# Patient Record
Sex: Female | Born: 1970 | Race: White | Hispanic: No | Marital: Married | Smoking: Former smoker
Health system: Southern US, Community
[De-identification: ages and names within clinical notes are randomized; demographics above are authoritative.]

## PROBLEM LIST (undated history)

## (undated) DIAGNOSIS — N879 Dysplasia of cervix uteri, unspecified: Secondary | ICD-10-CM

## (undated) HISTORY — PX: COLPOSCOPY: SHX161

## (undated) HISTORY — DX: Dysplasia of cervix uteri, unspecified: N87.9

## (undated) HISTORY — PX: OTHER SURGICAL HISTORY: SHX169

## (undated) HISTORY — PX: LAPAROSCOPIC GASTRIC BANDING: SHX1100

## (undated) HISTORY — PX: GYNECOLOGIC CRYOSURGERY: SHX857

## (undated) HISTORY — PX: BACK SURGERY: SHX140

---

## 1998-03-18 ENCOUNTER — Inpatient Hospital Stay (HOSPITAL_COMMUNITY): Admission: RE | Admit: 1998-03-18 | Discharge: 1998-03-19 | Payer: Self-pay | Admitting: Neurosurgery

## 2000-06-23 ENCOUNTER — Encounter: Payer: Self-pay | Admitting: Neurosurgery

## 2000-06-23 ENCOUNTER — Ambulatory Visit (HOSPITAL_COMMUNITY): Admission: RE | Admit: 2000-06-23 | Discharge: 2000-06-23 | Payer: Self-pay | Admitting: Neurosurgery

## 2003-08-24 ENCOUNTER — Other Ambulatory Visit: Admission: RE | Admit: 2003-08-24 | Discharge: 2003-08-24 | Payer: Self-pay | Admitting: Obstetrics and Gynecology

## 2003-12-31 ENCOUNTER — Ambulatory Visit (HOSPITAL_COMMUNITY): Admission: RE | Admit: 2003-12-31 | Discharge: 2004-01-01 | Payer: Self-pay | Admitting: Neurosurgery

## 2006-06-21 ENCOUNTER — Other Ambulatory Visit: Admission: RE | Admit: 2006-06-21 | Discharge: 2006-06-21 | Payer: Self-pay | Admitting: Obstetrics and Gynecology

## 2006-10-30 ENCOUNTER — Emergency Department (HOSPITAL_COMMUNITY): Admission: EM | Admit: 2006-10-30 | Discharge: 2006-10-30 | Payer: Self-pay | Admitting: Emergency Medicine

## 2007-07-22 ENCOUNTER — Other Ambulatory Visit: Admission: RE | Admit: 2007-07-22 | Discharge: 2007-07-22 | Payer: Self-pay | Admitting: Obstetrics and Gynecology

## 2009-03-15 ENCOUNTER — Ambulatory Visit: Payer: Self-pay | Admitting: Obstetrics and Gynecology

## 2009-03-15 ENCOUNTER — Other Ambulatory Visit: Admission: RE | Admit: 2009-03-15 | Discharge: 2009-03-15 | Payer: Self-pay | Admitting: Obstetrics and Gynecology

## 2009-03-15 ENCOUNTER — Encounter: Payer: Self-pay | Admitting: Obstetrics and Gynecology

## 2009-03-16 ENCOUNTER — Ambulatory Visit: Payer: Self-pay | Admitting: Obstetrics and Gynecology

## 2009-04-08 ENCOUNTER — Encounter: Admission: RE | Admit: 2009-04-08 | Discharge: 2009-04-08 | Payer: Self-pay | Admitting: Obstetrics and Gynecology

## 2009-07-13 ENCOUNTER — Ambulatory Visit (HOSPITAL_COMMUNITY): Admission: RE | Admit: 2009-07-13 | Discharge: 2009-07-13 | Payer: Self-pay | Admitting: Surgery

## 2009-07-21 ENCOUNTER — Ambulatory Visit (HOSPITAL_COMMUNITY): Admission: RE | Admit: 2009-07-21 | Discharge: 2009-07-21 | Payer: Self-pay | Admitting: Surgery

## 2009-07-27 ENCOUNTER — Ambulatory Visit (HOSPITAL_BASED_OUTPATIENT_CLINIC_OR_DEPARTMENT_OTHER): Admission: RE | Admit: 2009-07-27 | Discharge: 2009-07-27 | Payer: Self-pay | Admitting: Surgery

## 2009-07-27 ENCOUNTER — Encounter: Admission: RE | Admit: 2009-07-27 | Discharge: 2009-07-27 | Payer: Self-pay | Admitting: Surgery

## 2009-07-30 ENCOUNTER — Ambulatory Visit: Payer: Self-pay | Admitting: Internal Medicine

## 2009-10-28 ENCOUNTER — Encounter: Admission: RE | Admit: 2009-10-28 | Discharge: 2009-11-09 | Payer: Self-pay | Admitting: Surgery

## 2009-11-21 ENCOUNTER — Ambulatory Visit (HOSPITAL_COMMUNITY): Admission: RE | Admit: 2009-11-21 | Discharge: 2009-11-22 | Payer: Self-pay | Admitting: Surgery

## 2009-12-07 ENCOUNTER — Encounter: Admission: RE | Admit: 2009-12-07 | Discharge: 2010-03-07 | Payer: Self-pay | Admitting: Surgery

## 2010-01-19 ENCOUNTER — Encounter: Admission: RE | Admit: 2010-01-19 | Discharge: 2010-03-13 | Payer: Self-pay | Admitting: Surgery

## 2010-03-17 ENCOUNTER — Other Ambulatory Visit: Admission: RE | Admit: 2010-03-17 | Discharge: 2010-03-17 | Payer: Self-pay | Admitting: Obstetrics and Gynecology

## 2010-03-17 ENCOUNTER — Ambulatory Visit: Payer: Self-pay | Admitting: Obstetrics and Gynecology

## 2010-05-01 ENCOUNTER — Encounter: Admission: RE | Admit: 2010-05-01 | Discharge: 2010-05-16 | Payer: Self-pay | Admitting: Surgery

## 2010-08-15 ENCOUNTER — Encounter
Admission: RE | Admit: 2010-08-15 | Discharge: 2010-08-15 | Payer: Self-pay | Source: Home / Self Care | Attending: Surgery | Admitting: Surgery

## 2011-01-28 LAB — COMPREHENSIVE METABOLIC PANEL
CO2: 26 mEq/L (ref 19–32)
Calcium: 8.8 mg/dL (ref 8.4–10.5)
Creatinine, Ser: 0.67 mg/dL (ref 0.4–1.2)
GFR calc non Af Amer: >60 mL/min (ref >60)
Glucose, Bld: 91 mg/dL (ref 70–99)

## 2011-01-28 LAB — DIFFERENTIAL
Eosinophils Absolute: 0.1 10*3/uL (ref 0.0–0.7)
Eosinophils Relative: 0 % (ref 0–5)
Eosinophils Relative: 1 % (ref 0–5)
Lymphocytes Relative: 18 % (ref 12–46)
Lymphs Abs: 1.8 10*3/uL (ref 0.7–4.0)
Lymphs Abs: 2.2 10*3/uL (ref 0.7–4.0)
Monocytes Absolute: 0.8 10*3/uL (ref 0.1–1.0)
Monocytes Relative: 5 % (ref 3–12)

## 2011-01-28 LAB — CBC
HCT: 37.9 % (ref 36.0–46.0)
HCT: 39.6 % (ref 36.0–46.0)
Hemoglobin: 12.9 g/dL (ref 12.0–15.0)
MCHC: 34.8 g/dL (ref 30.0–36.0)
MCV: 98.9 fL (ref 78.0–100.0)
Platelets: 233 10*3/uL (ref 150–400)
Platelets: 236 10*3/uL (ref 150–400)
RBC: 3.79 MIL/uL — ABNORMAL LOW (ref 3.87–5.11)
RBC: 4 MIL/uL (ref 3.87–5.11)
WBC: 10 10*3/uL (ref 4.0–10.5)
WBC: 7.4 10*3/uL (ref 4.0–10.5)

## 2011-01-29 ENCOUNTER — Other Ambulatory Visit: Payer: Self-pay | Admitting: Obstetrics and Gynecology

## 2011-01-29 DIAGNOSIS — Z1231 Encounter for screening mammogram for malignant neoplasm of breast: Secondary | ICD-10-CM

## 2011-02-06 ENCOUNTER — Ambulatory Visit
Admission: RE | Admit: 2011-02-06 | Discharge: 2011-02-06 | Disposition: A | Discharge status: Home / Self Care | Payer: BC Managed Care – PPO | Source: Ambulatory Visit | Attending: Obstetrics and Gynecology | Admitting: Obstetrics and Gynecology

## 2011-02-06 DIAGNOSIS — Z1231 Encounter for screening mammogram for malignant neoplasm of breast: Secondary | ICD-10-CM

## 2011-03-19 ENCOUNTER — Encounter (INDEPENDENT_AMBULATORY_CARE_PROVIDER_SITE_OTHER): Payer: BC Managed Care – PPO | Admitting: Obstetrics and Gynecology

## 2011-03-19 ENCOUNTER — Other Ambulatory Visit: Payer: Self-pay | Admitting: Obstetrics and Gynecology

## 2011-03-19 ENCOUNTER — Other Ambulatory Visit (HOSPITAL_COMMUNITY)
Admission: RE | Admit: 2011-03-19 | Discharge: 2011-03-19 | Disposition: A | Discharge status: Home / Self Care | Payer: BC Managed Care – PPO | Source: Ambulatory Visit | Attending: Obstetrics and Gynecology | Admitting: Obstetrics and Gynecology

## 2011-03-19 DIAGNOSIS — Z01419 Encounter for gynecological examination (general) (routine) without abnormal findings: Secondary | ICD-10-CM

## 2011-03-19 DIAGNOSIS — Z833 Family history of diabetes mellitus: Secondary | ICD-10-CM

## 2011-03-19 DIAGNOSIS — R823 Hemoglobinuria: Secondary | ICD-10-CM

## 2011-03-19 DIAGNOSIS — Z124 Encounter for screening for malignant neoplasm of cervix: Secondary | ICD-10-CM | POA: Insufficient documentation

## 2011-03-19 DIAGNOSIS — Z13220 Encounter for screening for lipoid disorders: Secondary | ICD-10-CM

## 2011-03-30 NOTE — Op Note (Signed)
NAMECLAUDIE, [name] NO.:  [id number]   MEDICAL RECORD NO.:  [id number]                   PATIENT TYPE:  OIB   LOCATION:  3172                                 FACILITY:  [facility]   PHYSICIAN:  Danae Orleans. Venetia Maxon, M.D.               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   PROCEDURE:  Right L5-S1 microdiskectomy with microdissection.   SURGEON:  Danae Orleans. Venetia Maxon, M.D.   ASSISTANT:  Clydene Fake, M.D.   ANESTHESIA:  General endotracheal anesthesia.   ESTIMATED BLOOD LOSS:  Minimal.   COMPLICATIONS:  None.   DISPOSITION:  To recovery.   INDICATIONS FOR PROCEDURE:  Shelby Martin is a 40 year old woman whose had  prior left L5-S1 disk herniation who now presents with a right L5-S1 disk  herniation and right lower extremity pain with an S1 radiculopathy.  It was  elected to take her surgery for microdiskectomy.   Shelby Martin was brought to the operating room. Following a satisfactory and  uncomplicated induction of general endotracheal anesthesia and placement of  intravenous lines, the patient was placed in the prone position on the  Wilson frame, her low back was then prepped and draped in the usual sterile  fashion.  The area of planned incision was infiltrated with 0.25% Marcaine  and 0.5% lidocaine 1:200,000 epinephrine. An incision was made in the  midline and carried through approximately 6 inches of adipose tissue to  along the dorsal fascia which was incised in the right side of the midline.  Subperiosteal dissection was performed exposing the L5-S1 interspace.  Intraoperative x-ray confirmed correct  orientation. A Versatrac  retractor  was used to facilitate exposure using extra long instrument. A  hemisemilaminectomy of L5 was performed with the high speed drill and then  completed with Kerrison rongeurs. Ligamentum flavum was detached and removed  in a piecemeal fashion. A foraminotomy was performed overlying the right S1  nerve root. The lateral aspect of the canal was identified and S1 nerve root  and thecal sac were retracted medially after mobilizing them with  microdissection technique under the operating microscope.  Multiple  fragments of disk material were identified and removed which were just  beneath the take off of the S1 nerve root. The interspace was then opened as  the pieces of disk extended into the interspace. The disk space was then  cleared out of residual disk material using a variety of pituitary rongeurs,  Epstein curettes and surgical __________ downgoing curettes. The  hypertrophied osteophytic ledge was removed on the S1 level and both the S1  nerve root and the common  dural tube were well decompressed.  Hemostasis was  assured, the wound was copiously irrigated with bacitracin and saline. The  self retaining retractor was removed, microscope was taken out of the field,  lumbodorsal fascia was closed with #0 Vicryl suture. The subcutaneous tissue  was reapproximated with 2-0 Vicryl interrupted inverted sutures and the skin  edges reapproximated with interrupted 3-0 Vicryl subcuticular stitch and the  wound dressed with Dermabond.  The patient was extubated in the operating  room and taken to the recovery room in stable satisfactory condition having  tolerated the operation well. Counts were correct at the end of the case.                                               Danae Orleans. Venetia Maxon, M.D.    JDS/MEDQ  D:  12/31/2003  T:  01/01/2004  Job:  [id number]

## 2011-10-19 ENCOUNTER — Encounter: Payer: Self-pay | Admitting: Gynecology

## 2011-10-23 ENCOUNTER — Other Ambulatory Visit: Payer: Self-pay | Admitting: *Deleted

## 2011-10-23 ENCOUNTER — Telehealth: Payer: Self-pay | Admitting: *Deleted

## 2011-10-23 DIAGNOSIS — Z3049 Encounter for surveillance of other contraceptives: Secondary | ICD-10-CM

## 2011-10-23 MED ORDER — LEVONORGESTREL 20 MCG/24HR IU IUD: INTRAUTERINE_SYSTEM | Freq: Once | INTRAUTERINE | Status: AC

## 2011-10-23 NOTE — Telephone Encounter (Signed)
Patient informed benefits on Mirena IUD is $30 copay.  Will have remove/insert on 10/30/11.

## 2011-10-30 ENCOUNTER — Ambulatory Visit (INDEPENDENT_AMBULATORY_CARE_PROVIDER_SITE_OTHER): Payer: BC Managed Care – PPO | Admitting: Obstetrics and Gynecology

## 2011-10-30 ENCOUNTER — Encounter: Payer: Self-pay | Admitting: Obstetrics and Gynecology

## 2011-10-30 DIAGNOSIS — Z30431 Encounter for routine checking of intrauterine contraceptive device: Secondary | ICD-10-CM

## 2011-10-30 DIAGNOSIS — Z3049 Encounter for surveillance of other contraceptives: Secondary | ICD-10-CM

## 2011-10-30 NOTE — Progress Notes (Signed)
Patient came in to get her Mirena IUD changed. It has been 5 years. She is very happy with it  She is amenorrheic.   Exam: Kennon Portela present. Pelvic within normal limits.  Assessment: Desire for Mirena IUD.  Plan: Mirena IUD removed with ease. New mirena  IUD inserted with the use. Patient to return in 6 weeks.

## 2012-01-21 ENCOUNTER — Other Ambulatory Visit: Payer: Self-pay | Admitting: Obstetrics and Gynecology

## 2012-01-21 DIAGNOSIS — Z1231 Encounter for screening mammogram for malignant neoplasm of breast: Secondary | ICD-10-CM

## 2012-02-08 ENCOUNTER — Ambulatory Visit
Admission: RE | Admit: 2012-02-08 | Discharge: 2012-02-08 | Disposition: A | Discharge status: Home / Self Care | Payer: BC Managed Care – PPO | Source: Ambulatory Visit | Attending: Obstetrics and Gynecology | Admitting: Obstetrics and Gynecology

## 2012-02-08 DIAGNOSIS — Z1231 Encounter for screening mammogram for malignant neoplasm of breast: Secondary | ICD-10-CM

## 2012-03-19 ENCOUNTER — Ambulatory Visit (INDEPENDENT_AMBULATORY_CARE_PROVIDER_SITE_OTHER): Payer: BC Managed Care – PPO | Admitting: Obstetrics and Gynecology

## 2012-03-19 ENCOUNTER — Encounter: Payer: Self-pay | Admitting: Obstetrics and Gynecology

## 2012-03-19 VITALS — BP 130/82 | Ht 65.0 in | Wt 168.0 lb

## 2012-03-19 DIAGNOSIS — Z01419 Encounter for gynecological examination (general) (routine) without abnormal findings: Secondary | ICD-10-CM

## 2012-03-19 DIAGNOSIS — Z833 Family history of diabetes mellitus: Secondary | ICD-10-CM

## 2012-03-19 DIAGNOSIS — N879 Dysplasia of cervix uteri, unspecified: Secondary | ICD-10-CM | POA: Insufficient documentation

## 2012-03-19 LAB — CBC WITH DIFFERENTIAL/PLATELET
Basophils Absolute: 0 10*3/uL (ref 0.0–0.1)
Basophils Relative: 0 % (ref 0–1)
Eosinophils Absolute: 0.1 10*3/uL (ref 0.0–0.7)
Eosinophils Relative: 1 % (ref 0–5)
Lymphocytes Relative: 34 % (ref 12–46)
MCH: 34.5 pg — ABNORMAL HIGH (ref 26.0–34.0)
MCHC: 33.8 g/dL (ref 30.0–36.0)
MCV: 102.3 fL — ABNORMAL HIGH (ref 78.0–100.0)
Platelets: 195 10*3/uL (ref 150–400)
RDW: 13.5 % (ref 11.5–15.5)
WBC: 6.8 10*3/uL (ref 4.0–10.5)

## 2012-03-19 NOTE — Progress Notes (Signed)
Patient came to see me today for her annual GYN exam. She is very happy with her second IUD. She has very light cycles. She does her mammogram. She is having no abnormal bleeding. She is having no pelvic pain. She was treated as a teenager with cryosurgery for CIN. She has had normal Paps since then.  Physical examination:   Shelby Martin present. HEENT within normal limits. Neck: Thyroid not large. No masses. Supraclavicular nodes: not enlarged. Breasts: Examined in both sitting and lying  position. No skin changes and no masses. Abdomen: Soft no guarding rebound or masses or hernia. Pelvic: External: Within normal limits. BUS: Within normal limits. Vaginal:within normal limits. Good estrogen effect. No evidence of cystocele rectocele or enterocele. Cervix: clean IUD string visible. Uterus: Normal size and shape. Adnexa: No masses. Rectovaginal exam: Confirmatory and negative. Extremities: Within normal limits.  Assessment: #1. Normal GYN exam #2. CIN as a teenager-status post cryosurgery with normal Paps since that.  Plan: Continue yearly mammograms.

## 2012-03-20 LAB — URINALYSIS W MICROSCOPIC + REFLEX CULTURE
Casts: NONE SEEN
Glucose, UA: NEGATIVE mg/dL
Hgb urine dipstick: NEGATIVE
pH: 7.5 (ref 5.0–8.0)

## 2013-03-24 ENCOUNTER — Other Ambulatory Visit: Payer: Self-pay

## 2013-03-24 DIAGNOSIS — Z1231 Encounter for screening mammogram for malignant neoplasm of breast: Secondary | ICD-10-CM

## 2013-04-01 ENCOUNTER — Encounter: Payer: Self-pay | Admitting: Obstetrics and Gynecology

## 2013-04-01 ENCOUNTER — Encounter: Payer: Self-pay | Admitting: Women's Health

## 2013-04-01 ENCOUNTER — Ambulatory Visit (INDEPENDENT_AMBULATORY_CARE_PROVIDER_SITE_OTHER): Payer: BC Managed Care – PPO | Admitting: Women's Health

## 2013-04-01 ENCOUNTER — Other Ambulatory Visit (HOSPITAL_COMMUNITY)
Admission: RE | Admit: 2013-04-01 | Discharge: 2013-04-01 | Disposition: A | Discharge status: Home / Self Care | Payer: BC Managed Care – PPO | Source: Ambulatory Visit | Attending: Women's Health | Admitting: Women's Health

## 2013-04-01 VITALS — BP 120/76 | Ht 66.0 in | Wt 164.0 lb

## 2013-04-01 DIAGNOSIS — Z975 Presence of (intrauterine) contraceptive device: Secondary | ICD-10-CM

## 2013-04-01 DIAGNOSIS — Z01419 Encounter for gynecological examination (general) (routine) without abnormal findings: Secondary | ICD-10-CM

## 2013-04-01 DIAGNOSIS — Z13220 Encounter for screening for lipoid disorders: Secondary | ICD-10-CM

## 2013-04-01 DIAGNOSIS — Z833 Family history of diabetes mellitus: Secondary | ICD-10-CM

## 2013-04-01 DIAGNOSIS — N879 Dysplasia of cervix uteri, unspecified: Secondary | ICD-10-CM

## 2013-04-01 DIAGNOSIS — E079 Disorder of thyroid, unspecified: Secondary | ICD-10-CM

## 2013-04-01 LAB — TSH: TSH: 1.37 u[IU]/mL (ref 0.350–4.500)

## 2013-04-01 LAB — LIPID PANEL
Cholesterol: 173 mg/dL (ref 0–200)
VLDL: 12 mg/dL (ref 0–40)

## 2013-04-01 LAB — CBC WITH DIFFERENTIAL/PLATELET
Basophils Absolute: 0 10*3/uL (ref 0.0–0.1)
Basophils Relative: 1 % (ref 0–1)
Eosinophils Absolute: 0.1 10*3/uL (ref 0.0–0.7)
MCH: 33.6 pg (ref 26.0–34.0)
MCHC: 34.4 g/dL (ref 30.0–36.0)
Monocytes Absolute: 0.5 10*3/uL (ref 0.1–1.0)
Neutro Abs: 5.4 10*3/uL (ref 1.7–7.7)
Neutrophils Relative %: 68 % (ref 43–77)
RDW: 13.6 % (ref 11.5–15.5)

## 2013-04-01 NOTE — Patient Instructions (Signed)

## 2013-04-01 NOTE — Progress Notes (Signed)
Shelby Martin [birth year] [id number]    History:    The patient presents for annual exam.  Rare light cycle Mirena IUD placed 10/30/2011. History of normal mammograms. 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 Gastric banding in 2011 has lost and maintained weight. Smoker less than a half pack a day and is in the process of quitting.  Past medical history, past surgical history, family history and social history were all reviewed and documented in the EPIC chart. 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 Mother with diabetes on insulin and MS.   ROS:  A  ROS was performed and pertinent positives and negatives are included in the history.  Exam:  Filed Vitals:   04/01/13 0823  BP: 120/76    General appearance:  Normal Head/Neck:  Normal, without cervical or supraclavicular adenopathy. Thyroid:  Symmetrical, normal in size, without palpable masses or nodularity. Respiratory  Effort:  Normal  Auscultation:  Clear without wheezing or rhonchi Cardiovascular  Auscultation:  Regular rate, without rubs, murmurs or gallops  Edema/varicosities:  Not grossly evident Abdominal  Soft,nontender, without masses, guarding or rebound.  Liver/spleen:  No organomegaly noted  Hernia:  None appreciated  Skin  Inspection:  Grossly normal  Palpation:  Grossly normal Neurologic/psychiatric  Orientation:  Normal with appropriate conversation.  Mood/affect:  Normal  Genitourinary    Breasts: Examined lying and sitting.     Right: Without masses, retractions, discharge or axillary adenopathy.     Left: Without masses, retractions, discharge or axillary adenopathy.   Inguinal/mons:  Normal without inguinal adenopathy  External genitalia:  Normal  BUS/Urethra/Skene's glands:  Normal  Bladder:  Normal  Vagina:  Normal  Cervix:  Normal, IUD string in os  Uterus:   normal in size, shape and contour.  Midline and mobile  Adnexa/parametria:      Rt: Without masses or tenderness.   Lt: Without masses or tenderness.  Anus and perineum: Normal  Digital rectal exam: Normal sphincter tone without palpated masses or tenderness  Assessment/Plan:  42 y.o. M WF G2 P1 for annual exam with no complaints.  Mirena IUD 10/2011 rare light cycle Gastric banding 2011 with weight loss and has maintained CIN-1-teenager-cryo- with normal Paps after Smoker less than half pack per day-process of quitting  Plan: SBE's, continue annual mammogram, calcium rich diet, vitamin D 1000 daily and regular exercise encouraged. CBC, glucose, lipid panel, UA, Pap. Continue to decrease/quit smoking is aware of hazards for health.   Harrington Challenger WHNP, 9:02 AM 04/01/2013

## 2013-04-02 LAB — URINALYSIS W MICROSCOPIC + REFLEX CULTURE
Bacteria, UA: NONE SEEN
Bilirubin Urine: NEGATIVE
Casts: NONE SEEN
Crystals: NONE SEEN
Glucose, UA: NEGATIVE mg/dL
Ketones, ur: NEGATIVE mg/dL
Specific Gravity, Urine: 1.009 (ref 1.005–1.030)
pH: 8 (ref 5.0–8.0)

## 2013-04-17 ENCOUNTER — Ambulatory Visit
Admission: RE | Admit: 2013-04-17 | Discharge: 2013-04-17 | Disposition: A | Discharge status: Home / Self Care | Payer: BC Managed Care – PPO | Source: Ambulatory Visit

## 2013-04-17 DIAGNOSIS — Z1231 Encounter for screening mammogram for malignant neoplasm of breast: Secondary | ICD-10-CM

## 2013-09-17 ENCOUNTER — Other Ambulatory Visit: Payer: Self-pay

## 2014-03-31 ENCOUNTER — Other Ambulatory Visit: Payer: Self-pay

## 2014-03-31 DIAGNOSIS — Z1231 Encounter for screening mammogram for malignant neoplasm of breast: Secondary | ICD-10-CM

## 2014-04-02 ENCOUNTER — Encounter: Payer: Self-pay | Admitting: Women's Health

## 2014-04-02 ENCOUNTER — Ambulatory Visit (INDEPENDENT_AMBULATORY_CARE_PROVIDER_SITE_OTHER): Payer: BC Managed Care – PPO | Admitting: Women's Health

## 2014-04-02 VITALS — BP 116/74 | Ht 65.5 in | Wt 168.8 lb

## 2014-04-02 DIAGNOSIS — Z01419 Encounter for gynecological examination (general) (routine) without abnormal findings: Secondary | ICD-10-CM

## 2014-04-02 DIAGNOSIS — Z833 Family history of diabetes mellitus: Secondary | ICD-10-CM

## 2014-04-02 NOTE — Addendum Note (Signed)
Addended by: Harrington Challenger on: 04/02/2014 09:41 AM   Modules accepted: Orders

## 2014-04-02 NOTE — Progress Notes (Signed)
Shelby Martin September 26, 1971 [id number]    History:    Presents for annual exam.  Mirena IUD placed 10/2011- amenorrheic. CIN-1 with cryo- as a teenager with normal Paps after. Normal mammogram history. 2011 gastric banding has lost 70 pounds and has kept off. Continues to smoke about a half a pack of cigarettes daily, trying to quit. Excellent lipid panel 2014.  Past medical history, past surgical history, family history and social history were all reviewed and documented in the EPIC chart. 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 Shelby Maduro 22 doing well. Mother diabetes and multiple sclerosis.  ROS:  A  12 point ROS was performed and pertinent positives and negatives are included.  Exam:  Filed Vitals:   04/02/14 0824  BP: 116/74    General appearance:  Normal Thyroid:  Symmetrical, normal in size, without palpable masses or nodularity. Respiratory  Auscultation:  Clear without wheezing or rhonchi Cardiovascular  Auscultation:  Regular rate, without rubs, murmurs or gallops  Edema/varicosities:  Not grossly evident Abdominal  Soft,nontender, without masses, guarding or rebound.  Liver/spleen:  No organomegaly noted  Hernia:  None appreciated  Skin  Inspection:  Grossly normal   Breasts: Examined lying and sitting.     Right: Without masses, retractions, discharge or axillary adenopathy.     Left: Without masses, retractions, discharge or axillary adenopathy. Gentitourinary   Inguinal/mons:  Normal without inguinal adenopathy  External genitalia:  Normal  BUS/Urethra/Skene's glands:  Normal  Vagina:  Normal  Cervix:  Normal IUD strings visible  Uterus:   normal in size, shape and contour.  Midline and mobile  Adnexa/parametria:     Rt: Without masses or tenderness.   Lt: Without masses or tenderness.  Anus and perineum: Normal  Digital rectal exam: Normal sphincter tone without palpated masses or tenderness  Assessment/Plan:  43 y.o.MWF G1P1  for annual exam with no  complaints  10/2011 Mirena IUD amenorrhea Smoker  Plan: Aware of hazards and smoking history and she decrease to quit. SBE's, continue annual mammogram, calcium rich diet, vitamin D 1000 daily and increase regular exercise encouraged. CBC, glucose, UA, Pap normal 2014, new screening guidelines reviewed.   Note: This dictation was prepared with Dragon/digital dictation.  Any transcriptional errors that result are unintentional. Harrington Challenger [facility], 9:14 AM 04/02/2014

## 2014-04-02 NOTE — Patient Instructions (Signed)

## 2014-04-03 LAB — URINALYSIS W MICROSCOPIC + REFLEX CULTURE
BILIRUBIN URINE: NEGATIVE
Bacteria, UA: NONE SEEN
CASTS: NONE SEEN
CRYSTALS: NONE SEEN
GLUCOSE, UA: NEGATIVE mg/dL
Hgb urine dipstick: NEGATIVE
Ketones, ur: NEGATIVE mg/dL
Leukocytes, UA: NEGATIVE
Nitrite: NEGATIVE
Protein, ur: NEGATIVE mg/dL
SPECIFIC GRAVITY, URINE: 1.014 (ref 1.005–1.030)
Urobilinogen, UA: 0.2 mg/dL (ref 0.0–1.0)
pH: 8 (ref 5.0–8.0)

## 2014-04-19 ENCOUNTER — Ambulatory Visit
Admission: RE | Admit: 2014-04-19 | Discharge: 2014-04-19 | Disposition: A | Discharge status: Home / Self Care | Payer: BC Managed Care – PPO | Source: Ambulatory Visit

## 2014-04-19 DIAGNOSIS — Z1231 Encounter for screening mammogram for malignant neoplasm of breast: Secondary | ICD-10-CM

## 2014-05-10 ENCOUNTER — Other Ambulatory Visit: Payer: Self-pay | Admitting: Obstetrics and Gynecology

## 2014-05-10 DIAGNOSIS — R928 Other abnormal and inconclusive findings on diagnostic imaging of breast: Secondary | ICD-10-CM

## 2014-05-20 ENCOUNTER — Ambulatory Visit
Admission: RE | Admit: 2014-05-20 | Discharge: 2014-05-20 | Disposition: A | Discharge status: Home / Self Care | Payer: BC Managed Care – PPO | Source: Ambulatory Visit | Attending: Obstetrics and Gynecology | Admitting: Obstetrics and Gynecology

## 2014-05-20 DIAGNOSIS — R928 Other abnormal and inconclusive findings on diagnostic imaging of breast: Secondary | ICD-10-CM

## 2014-09-13 ENCOUNTER — Encounter: Payer: Self-pay | Admitting: Women's Health

## 2015-03-21 ENCOUNTER — Other Ambulatory Visit: Payer: Self-pay

## 2015-03-21 DIAGNOSIS — Z1231 Encounter for screening mammogram for malignant neoplasm of breast: Secondary | ICD-10-CM

## 2015-04-06 ENCOUNTER — Encounter: Payer: Self-pay | Admitting: Women's Health

## 2015-04-06 ENCOUNTER — Ambulatory Visit (INDEPENDENT_AMBULATORY_CARE_PROVIDER_SITE_OTHER): Payer: BC Managed Care – PPO | Admitting: Women's Health

## 2015-04-06 ENCOUNTER — Other Ambulatory Visit (HOSPITAL_COMMUNITY)
Admission: RE | Admit: 2015-04-06 | Discharge: 2015-04-06 | Disposition: A | Discharge status: Home / Self Care | Payer: BC Managed Care – PPO | Source: Ambulatory Visit | Attending: Women's Health | Admitting: Women's Health

## 2015-04-06 VITALS — BP 128/80 | Ht 65.0 in | Wt 170.0 lb

## 2015-04-06 DIAGNOSIS — Z01419 Encounter for gynecological examination (general) (routine) without abnormal findings: Secondary | ICD-10-CM | POA: Insufficient documentation

## 2015-04-06 DIAGNOSIS — Z1151 Encounter for screening for human papillomavirus (HPV): Secondary | ICD-10-CM | POA: Diagnosis present

## 2015-04-06 DIAGNOSIS — Z13220 Encounter for screening for lipoid disorders: Secondary | ICD-10-CM | POA: Diagnosis not present

## 2015-04-06 LAB — CBC WITH DIFFERENTIAL/PLATELET
Basophils Absolute: 0.1 10*3/uL (ref 0.0–0.1)
Basophils Relative: 1 % (ref 0–1)
EOS ABS: 0.1 10*3/uL (ref 0.0–0.7)
Eosinophils Relative: 1 % (ref 0–5)
HCT: 44 % (ref 36.0–46.0)
HEMOGLOBIN: 14.8 g/dL (ref 12.0–15.0)
LYMPHS ABS: 1.9 10*3/uL (ref 0.7–4.0)
LYMPHS PCT: 38 % (ref 12–46)
MCH: 33.4 pg (ref 26.0–34.0)
MCHC: 33.6 g/dL (ref 30.0–36.0)
MCV: 99.3 fL (ref 78.0–100.0)
MONO ABS: 0.4 10*3/uL (ref 0.1–1.0)
MPV: 10.5 fL (ref 8.6–12.4)
Monocytes Relative: 7 % (ref 3–12)
NEUTROS ABS: 2.7 10*3/uL (ref 1.7–7.7)
Neutrophils Relative %: 53 % (ref 43–77)
Platelets: 226 10*3/uL (ref 150–400)
RBC: 4.43 MIL/uL (ref 3.87–5.11)
RDW: 14.2 % (ref 11.5–15.5)
WBC: 5 10*3/uL (ref 4.0–10.5)

## 2015-04-06 LAB — COMPREHENSIVE METABOLIC PANEL
ALBUMIN: 4.1 g/dL (ref 3.5–5.2)
ALT: 12 U/L (ref 0–35)
AST: 14 U/L (ref 0–37)
Alkaline Phosphatase: 57 U/L (ref 39–117)
BILIRUBIN TOTAL: 0.6 mg/dL (ref 0.2–1.2)
BUN: 10 mg/dL (ref 6–23)
CALCIUM: 9.1 mg/dL (ref 8.4–10.5)
CO2: 23 mEq/L (ref 19–32)
Chloride: 103 mEq/L (ref 96–112)
Creat: 0.62 mg/dL (ref 0.50–1.10)
Glucose, Bld: 93 mg/dL (ref 70–99)
POTASSIUM: 4.1 meq/L (ref 3.5–5.3)
Sodium: 136 mEq/L (ref 135–145)
Total Protein: 7 g/dL (ref 6.0–8.3)

## 2015-04-06 LAB — LIPID PANEL
CHOL/HDL RATIO: 3.1 ratio
CHOLESTEROL: 185 mg/dL (ref 0–200)
HDL: 59 mg/dL (ref >=46)
LDL Cholesterol: 109 mg/dL — ABNORMAL HIGH (ref 0–99)
TRIGLYCERIDES: 85 mg/dL (ref <150)
VLDL: 17 mg/dL (ref 0–40)

## 2015-04-06 NOTE — Progress Notes (Signed)
Shelby Martin 01/19/1971 [id number]    History:    Presents for annual exam.  Mirena IUD placed 10/2011 amenorrhea with no menopausal symptoms. Had cryo-for CIN-1 as a teenager with normal Paps after. Normal mammogram history. 2011 gastric banding husband down 70 pounds and has maintained. Smoker less than half pack daily.  Past medical history, past surgical history, family history and social history were all reviewed and documented in the EPIC chart. 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. Mother diabetes and MS, father diet-controlled diabetes. Son 23 doing well.  ROS:  A ROS was performed and pertinent positives and negatives are included.  Exam:  Filed Vitals:   04/06/15 0759  BP: 128/80    General appearance:  Normal Thyroid:  Symmetrical, normal in size, without palpable masses or nodularity. Respiratory  Auscultation:  Clear without wheezing or rhonchi Cardiovascular  Auscultation:  Regular rate, without rubs, murmurs or gallops  Edema/varicosities:  Not grossly evident Abdominal  Soft,nontender, without masses, guarding or rebound.  Liver/spleen:  No organomegaly noted  Hernia:  None appreciated  Skin  Inspection:  Grossly normal   Breasts: Examined lying and sitting.     Right: Without masses, retractions, discharge or axillary adenopathy.     Left: Without masses, retractions, discharge or axillary adenopathy. Gentitourinary   Inguinal/mons:  Normal without inguinal adenopathy  External genitalia:  Normal  BUS/Urethra/Skene's glands:  Normal  Vagina:  Normal  Cervix:  Normal IUD strings visible in os,   Uterus:   normal in size, shape and contour.  Midline and mobile  Adnexa/parametria:     Rt: Without masses or tenderness.   Lt: Without masses or tenderness.  Anus and perineum: Normal  Digital rectal exam: Normal sphincter tone without palpated masses or tenderness  Assessment/Plan:  44 y.o. MWF G1P1 for annual exam with no  complaints.  10/2011 Mirena IUD/amenorrhea 2011 gastric band with maintained 70 pound weight loss Smoker  Plan: Aware of hazards of smoking ways to decrease discussed. SBE's, continue annual screening mammogram, calcium rich diet,  vitamin D 1000 daily encouraged. Reviewed importance of regular daily exercise, decreasing carbs for continued weight loss. CBC, lipid panel, CMP, UA, Pap with HR HPV typing, new screening guidelines reviewed.    Harrington ChallengerYOUNG,Shelby Martin WHNP, 1:52 PM 04/06/2015

## 2015-04-06 NOTE — Patient Instructions (Signed)
Health Maintenance Adopting a healthy lifestyle and getting preventive care can go a long way to promote health and wellness. Talk with your health care provider about what schedule of regular examinations is right for you. This is a good chance for you to check in with your provider about disease prevention and staying healthy. In between checkups, there are plenty of things you can do on your own. Experts have done a lot of research about which lifestyle changes and preventive measures are most likely to keep you healthy. Ask your health care provider for more information. WEIGHT AND DIET  Eat a healthy diet  Be sure to include plenty of vegetables, fruits, low-fat dairy products, and lean protein.  Do not eat a lot of foods high in solid fats, added sugars, or salt.  Get regular exercise. This is one of the most important things you can do for your health.  Most adults should exercise for at least 150 minutes each week. The exercise should increase your heart rate and make you sweat (moderate-intensity exercise).  Most adults should also do strengthening exercises at least twice a week. This is in addition to the moderate-intensity exercise.  Maintain a healthy weight  Body mass index (BMI) is a measurement that can be used to identify possible weight problems. It estimates body fat based on height and weight. Your health care provider can help determine your BMI and help you achieve or maintain a healthy weight.  For females 25 years of age and older:   A BMI below 18.5 is considered underweight.  A BMI of 18.5 to 24.9 is normal.  A BMI of 25 to 29.9 is considered overweight.  A BMI of 30 and above is considered obese.  Watch levels of cholesterol and blood lipids  You should start having your blood tested for lipids and cholesterol at 46 years of age, then have this test every 5 years.  You may need to have your cholesterol levels checked more often if:  Your lipid or  cholesterol levels are high.  You are older than 46 years of age.  You are at high risk for heart disease.  CANCER SCREENING   Lung Cancer  Lung cancer screening is recommended for adults 97-92 years old who are at high risk for lung cancer because of a history of smoking.  A yearly low-dose CT scan of the lungs is recommended for people who:  Currently smoke.  Have quit within the past 15 years.  Have at least a 30-pack-year history of smoking. A pack year is smoking an average of one pack of cigarettes a day for 1 year.  Yearly screening should continue until it has been 15 years since you quit.  Yearly screening should stop if you develop a health problem that would prevent you from having lung cancer treatment.  Breast Cancer  Practice breast self-awareness. This means understanding how your breasts normally appear and feel.  It also means doing regular breast self-exams. Let your health care provider know about any changes, no matter how small.  If you are in your 20s or 30s, you should have a clinical breast exam (CBE) by a health care provider every 1-3 years as part of a regular health exam.  If you are 76 or older, have a CBE every year. Also consider having a breast X-ray (mammogram) every year.  If you have a family history of breast cancer, talk to your health care provider about genetic screening.  If you are  at high risk for breast cancer, talk to your health care provider about having an MRI and a mammogram every year.  Breast cancer gene (BRCA) assessment is recommended for women who have family members with BRCA-related cancers. BRCA-related cancers include:  Breast.  Ovarian.  Tubal.  Peritoneal cancers.  Results of the assessment will determine the need for genetic counseling and BRCA1 and BRCA2 testing. Cervical Cancer Routine pelvic examinations to screen for cervical cancer are no longer recommended for nonpregnant women who are considered low  risk for cancer of the pelvic organs (ovaries, uterus, and vagina) and who do not have symptoms. A pelvic examination may be necessary if you have symptoms including those associated with pelvic infections. Ask your health care provider if a screening pelvic exam is right for you.   The Pap test is the screening test for cervical cancer for women who are considered at risk.  If you had a hysterectomy for a problem that was not cancer or a condition that could lead to cancer, then you no longer need Pap tests.  If you are older than 65 years, and you have had normal Pap tests for the past 10 years, you no longer need to have Pap tests.  If you have had past treatment for cervical cancer or a condition that could lead to cancer, you need Pap tests and screening for cancer for at least 20 years after your treatment.  If you no longer get a Pap test, assess your risk factors if they change (such as having a new sexual partner). This can affect whether you should start being screened again.  Some women have medical problems that increase their chance of getting cervical cancer. If this is the case for you, your health care provider may recommend more frequent screening and Pap tests.  The human papillomavirus (HPV) test is another test that may be used for cervical cancer screening. The HPV test looks for the virus that can cause cell changes in the cervix. The cells collected during the Pap test can be tested for HPV.  The HPV test can be used to screen women 30 years of age and older. Getting tested for HPV can extend the interval between normal Pap tests from three to five years.  An HPV test also should be used to screen women of any age who have unclear Pap test results.  After 46 years of age, women should have HPV testing as often as Pap tests.  Colorectal Cancer  This type of cancer can be detected and often prevented.  Routine colorectal cancer screening usually begins at 46 years of  age and continues through 46 years of age.  Your health care provider may recommend screening at an earlier age if you have risk factors for colon cancer.  Your health care provider may also recommend using home test kits to check for hidden blood in the stool.  A small camera at the end of a tube can be used to examine your colon directly (sigmoidoscopy or colonoscopy). This is done to check for the earliest forms of colorectal cancer.  Routine screening usually begins at age 50.  Direct examination of the colon should be repeated every 5-10 years through 46 years of age. However, you may need to be screened more often if early forms of precancerous polyps or small growths are found. Skin Cancer  Check your skin from head to toe regularly.  Tell your health care provider about any new moles or changes in   moles, especially if there is a change in a mole's shape or color.  Also tell your health care provider if you have a mole that is larger than the size of a pencil eraser.  Always use sunscreen. Apply sunscreen liberally and repeatedly throughout the day.  Protect yourself by wearing long sleeves, pants, a wide-brimmed hat, and sunglasses whenever you are outside. HEART DISEASE, DIABETES, AND HIGH BLOOD PRESSURE   Have your blood pressure checked at least every 1-2 years. High blood pressure causes heart disease and increases the risk of stroke.  If you are between 75 years and 42 years old, ask your health care provider if you should take aspirin to prevent strokes.  Have regular diabetes screenings. This involves taking a blood sample to check your fasting blood sugar level.  If you are at a normal weight and have a low risk for diabetes, have this test once every three years after 46 years of age.  If you are overweight and have a high risk for diabetes, consider being tested at a younger age or more often. PREVENTING INFECTION  Hepatitis B  If you have a higher risk for  hepatitis B, you should be screened for this virus. You are considered at high risk for hepatitis B if:  You were born in a country where hepatitis B is common. Ask your health care provider which countries are considered high risk.  Your parents were born in a high-risk country, and you have not been immunized against hepatitis B (hepatitis B vaccine).  You have HIV or AIDS.  You use needles to inject street drugs.  You live with someone who has hepatitis B.  You have had sex with someone who has hepatitis B.  You get hemodialysis treatment.  You take certain medicines for conditions, including cancer, organ transplantation, and autoimmune conditions. Hepatitis C  Blood testing is recommended for:  Everyone born from [birth year] through 1967.  Anyone with known risk factors for hepatitis C. Sexually transmitted infections (STIs)  You should be screened for sexually transmitted infections (STIs) including gonorrhea and chlamydia if:  You are sexually active and are younger than 46 years of age.  You are older than 46 years of age and your health care provider tells you that you are at risk for this type of infection.  Your sexual activity has changed since you were last screened and you are at an increased risk for chlamydia or gonorrhea. Ask your health care provider if you are at risk.  If you do not have HIV, but are at risk, it may be recommended that you take a prescription medicine daily to prevent HIV infection. This is called pre-exposure prophylaxis (PrEP). You are considered at risk if:  You are sexually active and do not regularly use condoms or know the HIV status of your partner(s).  You take drugs by injection.  You are sexually active with a partner who has HIV. Talk with your health care provider about whether you are at high risk of being infected with HIV. If you choose to begin PrEP, you should first be tested for HIV. You should then be tested every 3 months for  as long as you are taking PrEP.  PREGNANCY   If you are premenopausal and you may become pregnant, ask your health care provider about preconception counseling.  If you may become pregnant, take 400 to 800 micrograms (mcg) of folic acid every day.  If you want to prevent pregnancy, talk to your  health care provider about birth control (contraception). OSTEOPOROSIS AND MENOPAUSE   Osteoporosis is a disease in which the bones lose minerals and strength with aging. This can result in serious bone fractures. Your risk for osteoporosis can be identified using a bone density scan.  If you are 65 years of age or older, or if you are at risk for osteoporosis and fractures, ask your health care provider if you should be screened.  Ask your health care provider whether you should take a calcium or vitamin D supplement to lower your risk for osteoporosis.  Menopause may have certain physical symptoms and risks.  Hormone replacement therapy may reduce some of these symptoms and risks. Talk to your health care provider about whether hormone replacement therapy is right for you.  HOME CARE INSTRUCTIONS   Schedule regular health, dental, and eye exams.  Stay current with your immunizations.   Do not use any tobacco products including cigarettes, chewing tobacco, or electronic cigarettes.  If you are pregnant, do not drink alcohol.  If you are breastfeeding, limit how much and how often you drink alcohol.  Limit alcohol intake to no more than 1 drink per day for nonpregnant women. One drink equals 12 ounces of beer, 5 ounces of wine, or 1 ounces of hard liquor.  Do not use street drugs.  Do not share needles.  Ask your health care provider for help if you need support or information about quitting drugs.  Tell your health care provider if you often feel depressed.  Tell your health care provider if you have ever been abused or do not feel safe at home. Document Released: 05/14/2011  Document Revised: 03/15/2014 Document Reviewed: 09/30/2013 [employer] Patient Information 2015 [employer]. This information is not intended to replace advice given to you by your health care provider. Make sure you discuss any questions you have with your health care provider. Exercise to Stay Healthy Exercise helps you become and stay healthy. EXERCISE IDEAS AND TIPS Choose exercises that:  You enjoy.  Fit into your day. You do not need to exercise really hard to be healthy. You can do exercises at a slow or medium level and stay healthy. You can:  Stretch before and after working out.  Try yoga, Pilates, or tai chi.  Lift weights.  Walk fast, swim, jog, run, climb stairs, bicycle, dance, or rollerskate.  Take aerobic classes. Exercises that burn about 150 calories:  Running 1  miles in 15 minutes.  Playing volleyball for 45 to 60 minutes.  Washing and waxing a car for 45 to 60 minutes.  Playing touch football for 45 minutes.  Walking 1  miles in 35 minutes.  Pushing a stroller 1  miles in 30 minutes.  Playing basketball for 30 minutes.  Raking leaves for 30 minutes.  Bicycling 5 miles in 30 minutes.  Walking 2 miles in 30 minutes.  Dancing for 30 minutes.  Shoveling snow for 15 minutes.  Swimming laps for 20 minutes.  Walking up stairs for 15 minutes.  Bicycling 4 miles in 15 minutes.  Gardening for 30 to 45 minutes.  Jumping rope for 15 minutes.  Washing windows or floors for 45 to 60 minutes. Document Released: 12/01/2010 Document Revised: 01/21/2012 Document Reviewed: 12/01/2010 [employer] Patient Information 2015 [employer]. This information is not intended to replace advice given to you by your health care provider. Make sure you discuss any questions you have with your health care provider.  

## 2015-04-07 ENCOUNTER — Encounter: Payer: Self-pay | Admitting: Women's Health

## 2015-04-07 LAB — URINALYSIS W MICROSCOPIC + REFLEX CULTURE
Bilirubin Urine: NEGATIVE
CASTS: NONE SEEN
CRYSTALS: NONE SEEN
Glucose, UA: NEGATIVE mg/dL
HGB URINE DIPSTICK: NEGATIVE
Ketones, ur: NEGATIVE mg/dL
Leukocytes, UA: NEGATIVE
Nitrite: NEGATIVE
PH: 5.5 (ref 5.0–8.0)
Protein, ur: NEGATIVE mg/dL
Specific Gravity, Urine: 1.024 (ref 1.005–1.030)
UROBILINOGEN UA: 0.2 mg/dL (ref 0.0–1.0)

## 2015-04-07 LAB — CYTOLOGY - PAP

## 2015-04-21 ENCOUNTER — Ambulatory Visit
Admission: RE | Admit: 2015-04-21 | Discharge: 2015-04-21 | Disposition: A | Discharge status: Home / Self Care | Payer: BC Managed Care – PPO | Source: Ambulatory Visit

## 2015-04-21 ENCOUNTER — Encounter: Payer: Self-pay | Admitting: Women's Health

## 2015-04-21 DIAGNOSIS — Z1231 Encounter for screening mammogram for malignant neoplasm of breast: Secondary | ICD-10-CM

## 2016-04-06 ENCOUNTER — Encounter: Payer: BC Managed Care – PPO | Admitting: Women's Health

## 2016-04-18 ENCOUNTER — Other Ambulatory Visit: Payer: Self-pay | Admitting: Women's Health

## 2016-04-18 ENCOUNTER — Ambulatory Visit (INDEPENDENT_AMBULATORY_CARE_PROVIDER_SITE_OTHER): Payer: BC Managed Care – PPO | Admitting: Women's Health

## 2016-04-18 ENCOUNTER — Encounter: Payer: Self-pay | Admitting: Women's Health

## 2016-04-18 VITALS — BP 128/80 | Ht 65.0 in | Wt 164.0 lb

## 2016-04-18 DIAGNOSIS — Z13220 Encounter for screening for lipoid disorders: Secondary | ICD-10-CM | POA: Diagnosis not present

## 2016-04-18 DIAGNOSIS — Z833 Family history of diabetes mellitus: Secondary | ICD-10-CM | POA: Diagnosis not present

## 2016-04-18 DIAGNOSIS — Z01419 Encounter for gynecological examination (general) (routine) without abnormal findings: Secondary | ICD-10-CM

## 2016-04-18 DIAGNOSIS — Z1329 Encounter for screening for other suspected endocrine disorder: Secondary | ICD-10-CM

## 2016-04-18 DIAGNOSIS — Z1231 Encounter for screening mammogram for malignant neoplasm of breast: Secondary | ICD-10-CM

## 2016-04-18 LAB — CBC WITH DIFFERENTIAL/PLATELET
Basophils Absolute: 45 cells/uL (ref 0–200)
Basophils Relative: 1 %
Eosinophils Absolute: 90 cells/uL (ref 15–500)
Eosinophils Relative: 2 %
HEMATOCRIT: 43.5 % (ref 35.0–45.0)
HEMOGLOBIN: 14.7 g/dL (ref 11.7–15.5)
Lymphocytes Relative: 40 %
Lymphs Abs: 1800 cells/uL (ref 850–3900)
MCH: 33.9 pg — AB (ref 27.0–33.0)
MCHC: 33.8 g/dL (ref 32.0–36.0)
MCV: 100.2 fL — ABNORMAL HIGH (ref 80.0–100.0)
MONO ABS: 360 {cells}/uL (ref 200–950)
MPV: 10.2 fL (ref 7.5–12.5)
Monocytes Relative: 8 %
NEUTROS PCT: 49 %
Neutro Abs: 2205 cells/uL (ref 1500–7800)
Platelets: 196 10*3/uL (ref 140–400)
RBC: 4.34 MIL/uL (ref 3.80–5.10)
RDW: 13.8 % (ref 11.0–15.0)
WBC: 4.5 10*3/uL (ref 3.8–10.8)

## 2016-04-18 LAB — LIPID PANEL
CHOL/HDL RATIO: 2.2 ratio (ref <=5.0)
Cholesterol: 184 mg/dL (ref 125–200)
HDL: 82 mg/dL (ref >=46)
LDL CALC: 87 mg/dL (ref <130)
Triglycerides: 77 mg/dL (ref <150)
VLDL: 15 mg/dL (ref <30)

## 2016-04-18 LAB — TSH: TSH: 1.22 m[IU]/L

## 2016-04-18 LAB — GLUCOSE, RANDOM: Glucose, Bld: 91 mg/dL (ref 65–99)

## 2016-04-18 NOTE — Patient Instructions (Signed)
Health Maintenance, Female Adopting a healthy lifestyle and getting preventive care can go a long way to promote health and wellness. Talk with your health care provider about what schedule of regular examinations is right for you. This is a good chance for you to check in with your provider about disease prevention and staying healthy. In between checkups, there are plenty of things you can do on your own. Experts have done a lot of research about which lifestyle changes and preventive measures are most likely to keep you healthy. Ask your health care provider for more information. WEIGHT AND DIET  Eat a healthy diet  Be sure to include plenty of vegetables, fruits, low-fat dairy products, and lean protein.  Do not eat a lot of foods high in solid fats, added sugars, or salt.  Get regular exercise. This is one of the most important things you can do for your health.  Most adults should exercise for at least 150 minutes each week. The exercise should increase your heart rate and make you sweat (moderate-intensity exercise).  Most adults should also do strengthening exercises at least twice a week. This is in addition to the moderate-intensity exercise.  Maintain a healthy weight  Body mass index (BMI) is a measurement that can be used to identify possible weight problems. It estimates body fat based on height and weight. Your health care provider can help determine your BMI and help you achieve or maintain a healthy weight.  For females 20 years of age and older:   A BMI below 18.5 is considered underweight.  A BMI of 18.5 to 24.9 is normal.  A BMI of 25 to 29.9 is considered overweight.  A BMI of 30 and above is considered obese.  Watch levels of cholesterol and blood lipids  You should start having your blood tested for lipids and cholesterol at 47 years of age, then have this test every 5 years.  You may need to have your cholesterol levels checked more often if:  Your lipid  or cholesterol levels are high.  You are older than 47 years of age.  You are at high risk for heart disease.  CANCER SCREENING   Lung Cancer  Lung cancer screening is recommended for adults 55-80 years old who are at high risk for lung cancer because of a history of smoking.  A yearly low-dose CT scan of the lungs is recommended for people who:  Currently smoke.  Have quit within the past 15 years.  Have at least a 30-pack-year history of smoking. A pack year is smoking an average of one pack of cigarettes a day for 1 year.  Yearly screening should continue until it has been 15 years since you quit.  Yearly screening should stop if you develop a health problem that would prevent you from having lung cancer treatment.  Breast Cancer  Practice breast self-awareness. This means understanding how your breasts normally appear and feel.  It also means doing regular breast self-exams. Let your health care provider know about any changes, no matter how small.  If you are in your 20s or 30s, you should have a clinical breast exam (CBE) by a health care provider every 1-3 years as part of a regular health exam.  If you are 40 or older, have a CBE every year. Also consider having a breast X-ray (mammogram) every year.  If you have a family history of breast cancer, talk to your health care provider about genetic screening.  If you   are at high risk for breast cancer, talk to your health care provider about having an MRI and a mammogram every year.  Breast cancer gene (BRCA) assessment is recommended for women who have family members with BRCA-related cancers. BRCA-related cancers include:  Breast.  Ovarian.  Tubal.  Peritoneal cancers.  Results of the assessment will determine the need for genetic counseling and BRCA1 and BRCA2 testing. Cervical Cancer Your health care provider may recommend that you be screened regularly for cancer of the pelvic organs (ovaries, uterus, and  vagina). This screening involves a pelvic examination, including checking for microscopic changes to the surface of your cervix (Pap test). You may be encouraged to have this screening done every 3 years, beginning at age 21.  For women ages 30-65, health care providers may recommend pelvic exams and Pap testing every 3 years, or they may recommend the Pap and pelvic exam, combined with testing for human papilloma virus (HPV), every 5 years. Some types of HPV increase your risk of cervical cancer. Testing for HPV may also be done on women of any age with unclear Pap test results.  Other health care providers may not recommend any screening for nonpregnant women who are considered low risk for pelvic cancer and who do not have symptoms. Ask your health care provider if a screening pelvic exam is right for you.  If you have had past treatment for cervical cancer or a condition that could lead to cancer, you need Pap tests and screening for cancer for at least 20 years after your treatment. If Pap tests have been discontinued, your risk factors (such as having a new sexual partner) need to be reassessed to determine if screening should resume. Some women have medical problems that increase the chance of getting cervical cancer. In these cases, your health care provider may recommend more frequent screening and Pap tests. Colorectal Cancer  This type of cancer can be detected and often prevented.  Routine colorectal cancer screening usually begins at 47 years of age and continues through 47 years of age.  Your health care provider may recommend screening at an earlier age if you have risk factors for colon cancer.  Your health care provider may also recommend using home test kits to check for hidden blood in the stool.  A small camera at the end of a tube can be used to examine your colon directly (sigmoidoscopy or colonoscopy). This is done to check for the earliest forms of colorectal  cancer.  Routine screening usually begins at age 50.  Direct examination of the colon should be repeated every 5-10 years through 47 years of age. However, you may need to be screened more often if early forms of precancerous polyps or small growths are found. Skin Cancer  Check your skin from head to toe regularly.  Tell your health care provider about any new moles or changes in moles, especially if there is a change in a mole's shape or color.  Also tell your health care provider if you have a mole that is larger than the size of a pencil eraser.  Always use sunscreen. Apply sunscreen liberally and repeatedly throughout the day.  Protect yourself by wearing long sleeves, pants, a wide-brimmed hat, and sunglasses whenever you are outside. HEART DISEASE, DIABETES, AND HIGH BLOOD PRESSURE   High blood pressure causes heart disease and increases the risk of stroke. High blood pressure is more likely to develop in:  People who have blood pressure in the high end   of the normal range (130-139/85-89 mm Hg).  People who are overweight or obese.  People who are African American.  If you are 38-23 years of age, have your blood pressure checked every 3-5 years. If you are 61 years of age or older, have your blood pressure checked every year. You should have your blood pressure measured twice--once when you are at a hospital or clinic, and once when you are not at a hospital or clinic. Record the average of the two measurements. To check your blood pressure when you are not at a hospital or clinic, you can use:  An automated blood pressure machine at a pharmacy.  A home blood pressure monitor.  If you are between 45 years and 39 years old, ask your health care provider if you should take aspirin to prevent strokes.  Have regular diabetes screenings. This involves taking a blood sample to check your fasting blood sugar level.  If you are at a normal weight and have a low risk for diabetes,  have this test once every three years after 47 years of age.  If you are overweight and have a high risk for diabetes, consider being tested at a younger age or more often. PREVENTING INFECTION  Hepatitis B  If you have a higher risk for hepatitis B, you should be screened for this virus. You are considered at high risk for hepatitis B if:  You were born in a country where hepatitis B is common. Ask your health care provider which countries are considered high risk.  Your parents were born in a high-risk country, and you have not been immunized against hepatitis B (hepatitis B vaccine).  You have HIV or AIDS.  You use needles to inject street drugs.  You live with someone who has hepatitis B.  You have had sex with someone who has hepatitis B.  You get hemodialysis treatment.  You take certain medicines for conditions, including cancer, organ transplantation, and autoimmune conditions. Hepatitis C  Blood testing is recommended for:  Everyone born from [birth year] through 1967.  Anyone with known risk factors for hepatitis C. Sexually transmitted infections (STIs)  You should be screened for sexually transmitted infections (STIs) including gonorrhea and chlamydia if:  You are sexually active and are younger than 47 years of age.  You are older than 47 years of age and your health care provider tells you that you are at risk for this type of infection.  Your sexual activity has changed since you were last screened and you are at an increased risk for chlamydia or gonorrhea. Ask your health care provider if you are at risk.  If you do not have HIV, but are at risk, it may be recommended that you take a prescription medicine daily to prevent HIV infection. This is called pre-exposure prophylaxis (PrEP). You are considered at risk if:  You are sexually active and do not regularly use condoms or know the HIV status of your partner(s).  You take drugs by injection.  You are sexually  active with a partner who has HIV. Talk with your health care provider about whether you are at high risk of being infected with HIV. If you choose to begin PrEP, you should first be tested for HIV. You should then be tested every 3 months for as long as you are taking PrEP.  PREGNANCY   If you are premenopausal and you may become pregnant, ask your health care provider about preconception counseling.  If you may  become pregnant, take 400 to 800 micrograms (mcg) of folic acid every day.  If you want to prevent pregnancy, talk to your health care provider about birth control (contraception). OSTEOPOROSIS AND MENOPAUSE   Osteoporosis is a disease in which the bones lose minerals and strength with aging. This can result in serious bone fractures. Your risk for osteoporosis can be identified using a bone density scan.  If you are 61 years of age or older, or if you are at risk for osteoporosis and fractures, ask your health care provider if you should be screened.  Ask your health care provider whether you should take a calcium or vitamin D supplement to lower your risk for osteoporosis.  Menopause may have certain physical symptoms and risks.  Hormone replacement therapy may reduce some of these symptoms and risks. Talk to your health care provider about whether hormone replacement therapy is right for you.  HOME CARE INSTRUCTIONS   Schedule regular health, dental, and eye exams.  Stay current with your immunizations.   Do not use any tobacco products including cigarettes, chewing tobacco, or electronic cigarettes.  If you are pregnant, do not drink alcohol.  If you are breastfeeding, limit how much and how often you drink alcohol.  Limit alcohol intake to no more than 1 drink per day for nonpregnant women. One drink equals 12 ounces of beer, 5 ounces of wine, or 1 ounces of hard liquor.  Do not use street drugs.  Do not share needles.  Ask your health care provider for help if  you need support or information about quitting drugs.  Tell your health care provider if you often feel depressed.  Tell your health care provider if you have ever been abused or do not feel safe at home.   This information is not intended to replace advice given to you by your health care provider. Make sure you discuss any questions you have with your health care provider.   Document Released: 05/14/2011 Document Revised: 11/19/2014 Document Reviewed: 09/30/2013 Elsevier Interactive Patient Education [employer].

## 2016-04-18 NOTE — Progress Notes (Signed)
Shelby Martin [birth year] [id number]    History:    Presents for annual exam.  Mirena IUD placed 10/2011 amenorrheic. Normal mammogram history. History of an abnormal Pap years ago with normal Paps after. Smoking less than 5 cigarettes daily. 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   Past medical history, past surgical history, family history and social history were all reviewed and documented in the EPIC chart. 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 Parents diabetes, father recently died of a MI. Mother MS doing okay. Son is 24.  ROS:  A ROS was performed and pertinent positives and negatives are included.  Exam:  Filed Vitals:   04/18/16 0823  BP: 128/80    General appearance:  Normal Thyroid:  Symmetrical, normal in size, without palpable masses or nodularity. Respiratory  Auscultation:  Clear without wheezing or rhonchi Cardiovascular  Auscultation:  Regular rate, without rubs, murmurs or gallops  Edema/varicosities:  Not grossly evident Abdominal  Soft,nontender, without masses, guarding or rebound.  Liver/spleen:  No organomegaly noted  Hernia:  None appreciated  Skin  Inspection:  Grossly normal   Breasts: Examined lying and sitting.     Right: Without masses, retractions, discharge or axillary adenopathy.     Left: Without masses, retractions, discharge or axillary adenopathy. Gentitourinary   Inguinal/mons:  Normal without inguinal adenopathy  External genitalia:  Normal  BUS/Urethra/Skene's glands:  Normal  Vagina:  Normal  Cervix:  Normal IUD strings visible  Uterus:   normal in size, shape and contour.  Midline and mobile  Adnexa/parametria:     Rt: Without masses or tenderness.   Lt: Without masses or tenderness.  Anus and perineum: Normal  Digital rectal exam: Normal sphincter tone without palpated masses or tenderness  Assessment/Plan:  45 y.o. M WF G1 P1 for annual exam with no complaints.  Mirena IUD  10/2011 amenorrhea  Minimal smoker  Plan: Contraception options reviewed currently having no menopausal symptoms, will remove and replace Mirena IUD in December will schedule with Dr. Lily PeerFernandez. Reviewed slight risk of infection, perforation or hemorrhage.. States has done well with IUD. SBE's, continue annual 3-D screening mammogram. Exercise, calcium rich diet, vitamin D 1000 daily encouraged. Aware of hazards of smoking, ways to decrease and quit reviewed. CBC, lipid panel, glucose, UA, Pap normal with negative HR HPV typing 2016, new screening guidelines reviewed.      Harrington ChallengerYOUNG,NANCY J [facility] CenterWHNP, 10:41 AM 04/18/2016

## 2016-04-19 ENCOUNTER — Encounter: Payer: Self-pay | Admitting: Women's Health

## 2016-04-19 LAB — URINALYSIS W MICROSCOPIC + REFLEX CULTURE
Bilirubin Urine: NEGATIVE
CASTS: NONE SEEN [LPF]
Crystals: NONE SEEN [HPF]
Glucose, UA: NEGATIVE
Hgb urine dipstick: NEGATIVE
Ketones, ur: NEGATIVE
Nitrite: NEGATIVE
RBC / HPF: NONE SEEN RBC/HPF (ref <=2)
Specific Gravity, Urine: 1.026 (ref 1.001–1.035)
YEAST: NONE SEEN [HPF]
pH: 7 (ref 5.0–8.0)

## 2016-04-21 LAB — URINE CULTURE

## 2016-04-24 ENCOUNTER — Telehealth: Payer: Self-pay | Admitting: Gynecology

## 2016-04-24 ENCOUNTER — Other Ambulatory Visit: Payer: Self-pay | Admitting: Women's Health

## 2016-04-24 DIAGNOSIS — R8271 Bacteriuria: Secondary | ICD-10-CM

## 2016-04-24 NOTE — Telephone Encounter (Signed)
04/24/16-I LM VM for pt that [facility] the Mirena for contraception under her $40 copay. Per [email or web address] [id number]

## 2016-05-02 ENCOUNTER — Other Ambulatory Visit: Payer: BC Managed Care – PPO

## 2016-05-02 DIAGNOSIS — R8271 Bacteriuria: Secondary | ICD-10-CM

## 2016-05-03 LAB — URINALYSIS W MICROSCOPIC + REFLEX CULTURE
Bacteria, UA: NONE SEEN [HPF]
Bilirubin Urine: NEGATIVE
CASTS: NONE SEEN [LPF]
Crystals: NONE SEEN [HPF]
GLUCOSE, UA: NEGATIVE
HGB URINE DIPSTICK: NEGATIVE
Ketones, ur: NEGATIVE
LEUKOCYTES UA: NEGATIVE
Nitrite: NEGATIVE
Protein, ur: NEGATIVE
RBC / HPF: NONE SEEN RBC/HPF (ref <=2)
Specific Gravity, Urine: 1.018 (ref 1.001–1.035)
WBC, UA: NONE SEEN WBC/HPF (ref <=5)
YEAST: NONE SEEN [HPF]
pH: 7.5 (ref 5.0–8.0)

## 2016-05-04 ENCOUNTER — Other Ambulatory Visit: Payer: Self-pay

## 2016-05-07 ENCOUNTER — Ambulatory Visit
Admission: RE | Admit: 2016-05-07 | Discharge: 2016-05-07 | Disposition: A | Discharge status: Home / Self Care | Payer: BC Managed Care – PPO | Source: Ambulatory Visit | Attending: Women's Health | Admitting: Women's Health

## 2016-05-07 DIAGNOSIS — Z1231 Encounter for screening mammogram for malignant neoplasm of breast: Secondary | ICD-10-CM

## 2016-05-10 ENCOUNTER — Other Ambulatory Visit: Payer: Self-pay | Admitting: Women's Health

## 2016-05-10 DIAGNOSIS — R928 Other abnormal and inconclusive findings on diagnostic imaging of breast: Secondary | ICD-10-CM

## 2016-05-17 ENCOUNTER — Ambulatory Visit
Admission: RE | Admit: 2016-05-17 | Discharge: 2016-05-17 | Disposition: A | Discharge status: Home / Self Care | Payer: BC Managed Care – PPO | Source: Ambulatory Visit | Attending: Women's Health | Admitting: Women's Health

## 2016-05-17 DIAGNOSIS — R928 Other abnormal and inconclusive findings on diagnostic imaging of breast: Secondary | ICD-10-CM

## 2016-10-31 ENCOUNTER — Ambulatory Visit: Payer: BC Managed Care – PPO | Admitting: Gynecology

## 2016-11-08 ENCOUNTER — Ambulatory Visit (INDEPENDENT_AMBULATORY_CARE_PROVIDER_SITE_OTHER): Payer: BC Managed Care – PPO | Admitting: Gynecology

## 2016-11-08 ENCOUNTER — Telehealth: Payer: Self-pay | Admitting: Gynecology

## 2016-11-08 ENCOUNTER — Encounter: Payer: Self-pay | Admitting: Gynecology

## 2016-11-08 VITALS — BP 126/80 | Ht 65.0 in | Wt 164.0 lb

## 2016-11-08 DIAGNOSIS — Z975 Presence of (intrauterine) contraceptive device: Secondary | ICD-10-CM | POA: Insufficient documentation

## 2016-11-08 DIAGNOSIS — Z30433 Encounter for removal and reinsertion of intrauterine contraceptive device: Secondary | ICD-10-CM | POA: Diagnosis not present

## 2016-11-08 MED ORDER — DOXYCYCLINE HYCLATE 100 MG PO CAPS: 100.0000 mg | ORAL_CAPSULE | Freq: Two times a day (BID) | ORAL | 0 refills | Status: DC

## 2016-11-08 NOTE — Progress Notes (Signed)
   Patient is a 45 year old that presented to the office to remove her expired Mirena IUD and to replace it with a new one. Patient has done well and her annual exam is up-to-date.                                                                    IUD procedure note       Patient presented to the office today for placement of Mirena IUD. The patient had previously been provided with literature information on this method of contraception. The risks benefits and pros and cons were discussed and all her questions were answered. She is fully aware that this form of contraception is 99% effective and is good for 5 years.  Pelvic exam: Bartholin urethra Skene glands: Within normal limits Vagina: No lesions or discharge Cervix: No lesions or discharge, IUD string was not visualized. The cervix was cleansed with Betadine solution. With the use of a small curet the IUD string was able to be retrieved and grasped and shown to the patient and discarded. Uterus: Anteverted position Adnexa: No masses or tenderness Rectal exam: Not done  The cervix was cleansed with Betadine solution once again. A single-tooth tenaculum was placed on the anterior cervical lip. The uterus sounded to 7 centimeter. The IUD was shown to the patient and inserted in a sterile fashion. The IUD string was trimmed. The single-tooth tenaculum was removed. Patient was instructed to return back to the office in one month for follow up.  For prophylaxis she'll be placed on Vibramycin 100 mg twice a day for 7 days.    Lot number [id number]

## 2016-11-08 NOTE — Telephone Encounter (Addendum)
Per Dr. Lily PeerFernandez let pt know he called in doxycycline

## 2016-11-08 NOTE — Patient Instructions (Signed)

## 2016-11-16 ENCOUNTER — Encounter: Payer: Self-pay | Admitting: Gynecology

## 2016-11-16 NOTE — Telephone Encounter (Signed)
Spoke with pt she is aware of prescription

## 2016-12-06 ENCOUNTER — Encounter: Payer: Self-pay | Admitting: Gynecology

## 2016-12-06 ENCOUNTER — Ambulatory Visit (INDEPENDENT_AMBULATORY_CARE_PROVIDER_SITE_OTHER): Payer: BC Managed Care – PPO | Admitting: Gynecology

## 2016-12-06 VITALS — BP 120/78 | Ht 65.0 in | Wt 164.0 lb

## 2016-12-06 DIAGNOSIS — Z30431 Encounter for routine checking of intrauterine contraceptive device: Secondary | ICD-10-CM

## 2016-12-06 NOTE — Progress Notes (Signed)
   Patient is a 46 year old was seen the office in 11/08/2016 to have her expired Mirena IUD and replaced with a new one. She is here today for the 1 month follow-up is asymptomatic and doing well.  Exam: Abdomen: Soft nontender no rebound or guarding Pelvic: Bartholin urethra Skene was within normal limits Vagina: No lesions or discharge Cervix: No lesions or discharge IUD string was visualized Uterus anteverted normal size shape and consistency Adnexa: No palpable masses or tenderness Rectal exam not done  Assessment/plan: One month status post replacement of Mirena IUD doing well. Patient scheduled to return to the office in June of this year for annual exam or when necessary.

## 2017-03-27 ENCOUNTER — Encounter: Payer: Self-pay | Admitting: Gynecology

## 2017-04-19 ENCOUNTER — Encounter: Payer: BC Managed Care – PPO | Admitting: Women's Health

## 2017-04-22 ENCOUNTER — Encounter: Payer: Self-pay | Admitting: Women's Health

## 2017-04-22 ENCOUNTER — Ambulatory Visit (INDEPENDENT_AMBULATORY_CARE_PROVIDER_SITE_OTHER): Payer: BC Managed Care – PPO | Admitting: Women's Health

## 2017-04-22 VITALS — BP 116/72 | Ht 65.0 in | Wt 158.0 lb

## 2017-04-22 DIAGNOSIS — Z01419 Encounter for gynecological examination (general) (routine) without abnormal findings: Secondary | ICD-10-CM | POA: Diagnosis not present

## 2017-04-22 LAB — CBC WITH DIFFERENTIAL/PLATELET
Basophils Absolute: 52 cells/uL (ref 0–200)
Basophils Relative: 1 %
EOS PCT: 2 %
Eosinophils Absolute: 104 cells/uL (ref 15–500)
HCT: 41.8 % (ref 35.0–45.0)
HEMOGLOBIN: 13.9 g/dL (ref 11.7–15.5)
Lymphocytes Relative: 34 %
Lymphs Abs: 1768 cells/uL (ref 850–3900)
MCH: 33.1 pg — AB (ref 27.0–33.0)
MCHC: 33.3 g/dL (ref 32.0–36.0)
MCV: 99.5 fL (ref 80.0–100.0)
MONOS PCT: 6 %
MPV: 9.6 fL (ref 7.5–12.5)
Monocytes Absolute: 312 cells/uL (ref 200–950)
NEUTROS ABS: 2964 {cells}/uL (ref 1500–7800)
Neutrophils Relative %: 57 %
PLATELETS: 202 10*3/uL (ref 140–400)
RBC: 4.2 MIL/uL (ref 3.80–5.10)
RDW: 13.7 % (ref 11.0–15.0)
WBC: 5.2 10*3/uL (ref 3.8–10.8)

## 2017-04-22 LAB — COMPREHENSIVE METABOLIC PANEL
ALBUMIN: 3.7 g/dL (ref 3.6–5.1)
ALT: 14 U/L (ref 6–29)
AST: 18 U/L (ref 10–35)
Alkaline Phosphatase: 59 U/L (ref 33–115)
BUN: 11 mg/dL (ref 7–25)
CHLORIDE: 107 mmol/L (ref 98–110)
CO2: 20 mmol/L (ref 20–31)
CREATININE: 0.7 mg/dL (ref 0.50–1.10)
Calcium: 9 mg/dL (ref 8.6–10.2)
Glucose, Bld: 95 mg/dL (ref 65–99)
Potassium: 4 mmol/L (ref 3.5–5.3)
SODIUM: 140 mmol/L (ref 135–146)
Total Bilirubin: 0.6 mg/dL (ref 0.2–1.2)
Total Protein: 6.6 g/dL (ref 6.1–8.1)

## 2017-04-22 NOTE — Patient Instructions (Signed)

## 2017-04-22 NOTE — Progress Notes (Signed)
Shelby Martin 10/19/1971 [id number]    History:    Presents for annual exam.  1Sudie Grumbling2/2017 Mirena IUD amenorrheic. History of an abnormal Pap greater than 20 years ago with normal Paps after. Excellent lipid panel 2017. Normal mammogram history. 2011 gastric banding has lost 75 pounds and  kept off.  Past medical history, past surgical history, family history and social history were all reviewed and documented in the EPIC chart. [place] [employer] in [employer]. Mother and S diabetes. Father deceased from MI. 1 son age 48 doing well.  ROS:  A ROS was performed and pertinent positives and negatives are included.  Exam:  Vitals:   04/22/17 0817  BP: 116/72  Weight: 158 lb (71.7 kg)  Height: 5\' 5"  (1.651 m)   Body mass index is 26.29 kg/m.   General appearance:  Normal Thyroid:  Symmetrical, normal in size, without palpable masses or nodularity. Respiratory  Auscultation:  Clear without wheezing or rhonchi Cardiovascular  Auscultation:  Regular rate, without rubs, murmurs or gallops  Edema/varicosities:  Not grossly evident Abdominal  Soft,nontender, without masses, guarding or rebound.  Liver/spleen:  No organomegaly noted  Hernia:  None appreciated  Skin  Inspection:  Grossly normal   Breasts: Examined lying and sitting.     Right: Without masses, retractions, discharge or axillary adenopathy.     Left: Without masses, retractions, discharge or axillary adenopathy. Gentitourinary   Inguinal/mons:  Normal without inguinal adenopathy  External genitalia:  Normal  BUS/Urethra/Skene's glands:  Normal  Vagina:  Normal  Cervix:  Normal  Uterus:  normal in size, shape and contour.  Midline and mobile  Adnexa/parametria:     Rt: Without masses or tenderness.   Lt: Without masses or tenderness.  Anus and perineum: Normal  Digital rectal exam: Normal sphincter tone without palpated masses or tenderness  Assessment/Plan:  46 y.o. MWF G2 P1 for annual exam with  no complaints.  10/2016 Mirena IUD-amenorrhea 2011 gastric banding down 75 pounds/stable  Plan: SBE's, continue annual screening mammogram, exercise, calcium rich diet, vitamin D 2000 daily encouraged. Reviewed importance of leisure activities helping to care for aging parents. CBC, CMP, Pap normal with negative HR HPV 2016, new screening guidelines reviewed.     Harrington Challengerancy J Young [facility]WHNP, 8:53 AM 04/22/2017

## 2017-07-22 ENCOUNTER — Other Ambulatory Visit: Payer: Self-pay | Admitting: Women's Health

## 2017-07-22 DIAGNOSIS — Z1231 Encounter for screening mammogram for malignant neoplasm of breast: Secondary | ICD-10-CM

## 2017-07-24 ENCOUNTER — Ambulatory Visit
Admission: RE | Admit: 2017-07-24 | Discharge: 2017-07-24 | Disposition: A | Discharge status: Home / Self Care | Payer: BC Managed Care – PPO | Source: Ambulatory Visit | Attending: Women's Health | Admitting: Women's Health

## 2017-07-24 DIAGNOSIS — Z1231 Encounter for screening mammogram for malignant neoplasm of breast: Secondary | ICD-10-CM

## 2017-07-25 ENCOUNTER — Encounter: Payer: Self-pay | Admitting: Women's Health

## 2018-04-28 ENCOUNTER — Encounter: Payer: Self-pay | Admitting: Women's Health

## 2018-04-28 ENCOUNTER — Ambulatory Visit: Payer: BC Managed Care – PPO | Admitting: Women's Health

## 2018-04-28 VITALS — BP 132/80 | Ht 65.0 in | Wt 150.0 lb

## 2018-04-28 DIAGNOSIS — Z01419 Encounter for gynecological examination (general) (routine) without abnormal findings: Secondary | ICD-10-CM

## 2018-04-28 DIAGNOSIS — Z9884 Bariatric surgery status: Secondary | ICD-10-CM | POA: Insufficient documentation

## 2018-04-28 DIAGNOSIS — Z13220 Encounter for screening for lipoid disorders: Secondary | ICD-10-CM

## 2018-04-28 DIAGNOSIS — R8290 Unspecified abnormal findings in urine: Secondary | ICD-10-CM | POA: Diagnosis not present

## 2018-04-28 NOTE — Progress Notes (Signed)
Shelby Martin 04-08-1971 [id number]    History:    Presents for annual exam. 10/2016 Mirena IUD amenorrheic. History of an abnormal pap greater than 20 years ago with normal paps after. Excellent lipid panel 2017. Normal mammogram history. 2011 gastric banding has lost 83 pounds and kept it off. Complains of urinary odor and incomplete emptying since 09/2017. Denies urinary frequency or burning. Left shoulder pain which radiates down left arm. Pending appointment with provider for f/u.  Past medical history, past surgical history, family history and social history were all reviewed and documented in the EPIC chart. [place] [employer] in [employer]. Mother with diabetes. Father deceased from MI. 1 son age 49 doing well with new girlfriend who has 49 year old child with behavior issues.   ROS:  A ROS was performed and pertinent positives and negatives are included.   Exam:  Vitals:   04/28/18 0822  BP: 132/80  Weight: 150 lb (68 kg)  Height: 5\' 5"  (1.651 m)   Body mass index is 24.96 kg/m.   General appearance:  Normal Thyroid:  Symmetrical, normal in size, without palpable masses or nodularity. Respiratory  Auscultation:  Clear without wheezing or rhonchi Cardiovascular  Auscultation:  Regular rate, without rubs, murmurs or gallops  Edema/varicosities:  Not grossly evident Abdominal  Soft,nontender, without masses, guarding or rebound.  Liver/spleen:  No organomegaly noted  Hernia:  None appreciated  Skin  Inspection:  Grossly normal   Breasts: Examined lying and sitting.     Right: Without masses, retractions, discharge or axillary adenopathy.     Left: Without masses, retractions, discharge or axillary adenopathy. Gentitourinary   Inguinal/mons:  Normal without inguinal adenopathy  External genitalia:  Normal  BUS/Urethra/Skene's glands:  Normal  Vagina:  Normal no odor noted  Cervix:  Normal, Mirena string visualized  Uterus:  Normal in size, shape  and contour.  Midline and mobile  Adnexa/parametria:     Rt: Without masses or tenderness.   Lt: Without masses or tenderness.  Anus and perineum: Normal  Digital rectal exam: Normal sphincter tone without palpated masses or tenderness  Assessment/Plan:  47 y.o.  MWF G2P1 for annual exam with c/o urinary odor and incomplete emptying. Appears well.  10/2016 Mirena IUD-amenorrhea, denies menopausal symptoms 2011 gastric banding down 83 pounds/ (8 pd wt loss this year) Urinary odor/incomplete emptying- UA, negative Left shoulder pain, follow-up by other provider  Plan: SBE's, continue annual screening mammogram, exercise, calcium rich diet, and vitamin D 2000 daily. Congratulated on maintaining and continued weight loss with healthy lifestyle.  Discussed UA results - negative and increasing water intake. Urine culture pending, CBC, CMP, lipid, and pap with HPV. New screening guidelines reviewed.  Harrington Challengerancy J Kyrene Longan [facility]NP, 9:01 AM 04/28/2018

## 2018-04-28 NOTE — Patient Instructions (Signed)

## 2018-04-28 NOTE — Addendum Note (Signed)
Addended by: Tito DineBONHAM, KIM A on: 04/28/2018 09:53 AM   Modules accepted: Orders

## 2018-04-30 ENCOUNTER — Other Ambulatory Visit: Payer: Self-pay | Admitting: Women's Health

## 2018-04-30 LAB — URINALYSIS, COMPLETE W/RFL CULTURE
Bilirubin Urine: NEGATIVE
Glucose, UA: NEGATIVE
Hgb urine dipstick: NEGATIVE
Hyaline Cast: NONE SEEN /LPF
Ketones, ur: NEGATIVE
LEUKOCYTE ESTERASE: NEGATIVE
Nitrites, Initial: NEGATIVE
Protein, ur: NEGATIVE
RBC / HPF: NONE SEEN /HPF (ref 0–2)
Specific Gravity, Urine: 1.015 (ref 1.001–1.03)
pH: 5.5 (ref 5.0–8.0)

## 2018-04-30 LAB — URINE CULTURE
MICRO NUMBER: 90727684
SPECIMEN QUALITY:: ADEQUATE

## 2018-04-30 LAB — CULTURE INDICATED

## 2018-04-30 MED ORDER — SULFAMETHOXAZOLE-TRIMETHOPRIM 800-160 MG PO TABS: 1.0000 | ORAL_TABLET | Freq: Two times a day (BID) | ORAL | 0 refills | Status: DC

## 2018-05-01 LAB — PAP, TP IMAGING W/ HPV RNA, RFLX HPV TYPE 16,18/45: HPV DNA HIGH RISK: NOT DETECTED

## 2018-09-02 ENCOUNTER — Other Ambulatory Visit: Payer: Self-pay | Admitting: Women's Health

## 2018-09-02 DIAGNOSIS — Z1231 Encounter for screening mammogram for malignant neoplasm of breast: Secondary | ICD-10-CM

## 2018-10-13 ENCOUNTER — Ambulatory Visit
Admission: RE | Admit: 2018-10-13 | Discharge: 2018-10-13 | Disposition: A | Discharge status: Home / Self Care | Payer: BC Managed Care – PPO | Source: Ambulatory Visit | Attending: Women's Health | Admitting: Women's Health

## 2018-10-13 DIAGNOSIS — Z1231 Encounter for screening mammogram for malignant neoplasm of breast: Secondary | ICD-10-CM

## 2019-04-30 ENCOUNTER — Encounter: Payer: BC Managed Care – PPO | Admitting: Gynecology

## 2019-05-01 ENCOUNTER — Other Ambulatory Visit: Payer: Self-pay

## 2019-05-04 ENCOUNTER — Ambulatory Visit: Payer: BC Managed Care – PPO | Admitting: Women's Health

## 2019-05-04 ENCOUNTER — Other Ambulatory Visit: Payer: Self-pay

## 2019-05-04 ENCOUNTER — Encounter: Payer: Self-pay | Admitting: Women's Health

## 2019-05-04 VITALS — BP 130/80 | Ht 65.0 in | Wt 140.0 lb

## 2019-05-04 DIAGNOSIS — Z01419 Encounter for gynecological examination (general) (routine) without abnormal findings: Secondary | ICD-10-CM | POA: Diagnosis not present

## 2019-05-04 DIAGNOSIS — Z13220 Encounter for screening for lipoid disorders: Secondary | ICD-10-CM | POA: Diagnosis not present

## 2019-05-04 NOTE — Progress Notes (Signed)
Shelby Martin 03-16-1971 [id number]    History:    Presents for annual exam.  10/2016 Mirena IUD, amenorrheic.  Abnormal Pap greater than 20 years ago with normal Paps since.  Normal mammogram history.  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  Past medical history, past surgical history, family history and social history were all reviewed and documented in the EPIC chart.  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  Son 26 doing well  ROS:  A ROS was performed and pertinent positives and negatives are included.  Exam:  Vitals:   05/04/19 0828  BP: 130/80  Weight: 140 lb (63.5 kg)  Height: 5\' 5"  (1.651 m)   Body mass index is 23.3 kg/m.   General appearance:  Normal Thyroid:  Symmetrical, normal in size, without palpable masses or nodularity. Respiratory  Auscultation:  Clear without wheezing or rhonchi Cardiovascular  Auscultation:  Regular rate, without rubs, murmurs or gallops  Edema/varicosities:  Not grossly evident Abdominal  Soft,nontender, without masses, guarding or rebound.  Liver/spleen:  No organomegaly noted  Hernia:  None appreciated  Skin  Inspection:  Grossly normal   Breasts: Examined lying and sitting.     Right: Without masses, retractions, discharge or axillary adenopathy.     Left: Without masses, retractions, discharge or axillary adenopathy. Gentitourinary   Inguinal/mons:  Normal without inguinal adenopathy  External genitalia:  Normal  BUS/Urethra/Skene's glands:  Normal  Vagina:  Normal  Cervix:  Normal IUD strings visible  Uterus:   normal in size, shape and contour.  Midline and mobile  Adnexa/parametria:     Rt: Without masses or tenderness.   Lt: Without masses or tenderness.  Anus and perineum: Normal  Digital rectal exam: Normal sphincter tone without palpated masses or tenderness  Assessment/Plan:  48 y.o. MWF G2, P1 for annual exam with no GYN  complaints.  03/2016 Mirena IUD amenorrheic Abnormal Pap greater than 20 years ago normal Paps after 2011 gastric banding down 100 pounds/maintained Reflux-scheduled follow-up  Plan: Ways to avoid reflux discussed.  Aware Mirena IUD is good for 5 years.  No menopausal symptoms.  SBEs, continue annual screening mammogram, calcium rich foods, vitamin D 1000 daily encouraged.  Aware of importance of exercise.  CBC, lipid panel, CMP, Pap normal 2019, new screening guidelines reviewed.   [place], 8:38 AM 05/04/2019

## 2019-05-04 NOTE — Patient Instructions (Signed)
Colonoscopy Dr. Celine Ahr [facility] 5 [phone]  Health Maintenance for Postmenopausal Women Menopause is a normal process in which your reproductive ability comes to an end. This process happens gradually over a span of months to years, usually between the ages of 18 and 59. Menopause is complete when you have missed 12 consecutive menstrual periods. It is important to talk with your health care provider about some of the most common conditions that affect postmenopausal women, such as heart disease, cancer, and bone loss (osteoporosis). Adopting a healthy lifestyle and getting preventive care can help to promote your health and wellness. Those actions can also lower your chances of developing some of these common conditions. What should I know about menopause? During menopause, you may experience a number of symptoms, such as:  Moderate-to-severe hot flashes.  Night sweats.  Decrease in sex drive.  Mood swings.  Headaches.  Tiredness.  Irritability.  Memory problems.  Insomnia. Choosing to treat or not to treat menopausal changes is an individual decision that you make with your health care provider. What should I know about hormone replacement therapy and supplements? Hormone therapy products are effective for treating symptoms that are associated with menopause, such as hot flashes and night sweats. Hormone replacement carries certain risks, especially as you become older. If you are thinking about using estrogen or estrogen with progestin treatments, discuss the benefits and risks with your health care provider. What should I know about heart disease and stroke? Heart disease, heart attack, and stroke become more likely as you age. This may be due, in part, to the hormonal changes that your body experiences during menopause. These can affect how your body processes dietary fats, triglycerides, and cholesterol. Heart attack and stroke are both medical emergencies. There are many  things that you can do to help prevent heart disease and stroke:  Have your blood pressure checked at least every 1-2 years. High blood pressure causes heart disease and increases the risk of stroke.  If you are 68-3 years old, ask your health care provider if you should take aspirin to prevent a heart attack or a stroke.  Do not use any tobacco products, including cigarettes, chewing tobacco, or electronic cigarettes. If you need help quitting, ask your health care provider.  It is important to eat a healthy diet and maintain a healthy weight. ? Be sure to include plenty of vegetables, fruits, low-fat dairy products, and lean protein. ? Avoid eating foods that are high in solid fats, added sugars, or salt (sodium).  Get regular exercise. This is one of the most important things that you can do for your health. ? Try to exercise for at least 150 minutes each week. The type of exercise that you do should increase your heart rate and make you sweat. This is known as moderate-intensity exercise. ? Try to do strengthening exercises at least twice each week. Do these in addition to the moderate-intensity exercise.  Know your numbers.Ask your health care provider to check your cholesterol and your blood glucose. Continue to have your blood tested as directed by your health care provider.  What should I know about cancer screening? There are several types of cancer. Take the following steps to reduce your risk and to catch any cancer development as early as possible. Breast Cancer  Practice breast self-awareness. ? This means understanding how your breasts normally appear and feel. ? It also means doing regular breast self-exams. Let your health care provider know about any  changes, no matter how small.  If you are 23 or older, have a clinician do a breast exam (clinical breast exam or CBE) every year. Depending on your age, family history, and medical history, it may be recommended that you  also have a yearly breast X-ray (mammogram).  If you have a family history of breast cancer, talk with your health care provider about genetic screening.  If you are at high risk for breast cancer, talk with your health care provider about having an MRI and a mammogram every year.  Breast cancer (BRCA) gene test is recommended for women who have family members with BRCA-related cancers. Results of the assessment will determine the need for genetic counseling and BRCA1 and for BRCA2 testing. BRCA-related cancers include these types: ? Breast. This occurs in males or females. ? Ovarian. ? Tubal. This may also be called fallopian tube cancer. ? Cancer of the abdominal or pelvic lining (peritoneal cancer). ? Prostate. ? Pancreatic. Cervical, Uterine, and Ovarian Cancer Your health care provider may recommend that you be screened regularly for cancer of the pelvic organs. These include your ovaries, uterus, and vagina. This screening involves a pelvic exam, which includes checking for microscopic changes to the surface of your cervix (Pap test).  For women ages 21-65, health care providers may recommend a pelvic exam and a Pap test every three years. For women ages 24-65, they may recommend the Pap test and pelvic exam, combined with testing for human papilloma virus (HPV), every five years. Some types of HPV increase your risk of cervical cancer. Testing for HPV may also be done on women of any age who have unclear Pap test results.  Other health care providers may not recommend any screening for nonpregnant women who are considered low risk for pelvic cancer and have no symptoms. Ask your health care provider if a screening pelvic exam is right for you.  If you have had past treatment for cervical cancer or a condition that could lead to cancer, you need Pap tests and screening for cancer for at least 20 years after your treatment. If Pap tests have been discontinued for you, your risk factors (such  as having a new sexual partner) need to be reassessed to determine if you should start having screenings again. Some women have medical problems that increase the chance of getting cervical cancer. In these cases, your health care provider may recommend that you have screening and Pap tests more often.  If you have a family history of uterine cancer or ovarian cancer, talk with your health care provider about genetic screening.  If you have vaginal bleeding after reaching menopause, tell your health care provider.  There are currently no reliable tests available to screen for ovarian cancer. Lung Cancer Lung cancer screening is recommended for adults 82-3 years old who are at high risk for lung cancer because of a history of smoking. A yearly low-dose CT scan of the lungs is recommended if you:  Currently smoke.  Have a history of at least 30 pack-years of smoking and you currently smoke or have quit within the past 15 years. A pack-year is smoking an average of one pack of cigarettes per day for one year. Yearly screening should:  Continue until it has been 15 years since you quit.  Stop if you develop a health problem that would prevent you from having lung cancer treatment. Colorectal Cancer  This type of cancer can be detected and can often be prevented.  Routine  colorectal cancer screening usually begins at age 23 and continues through age 71.  If you have risk factors for colon cancer, your health care provider may recommend that you be screened at an earlier age.  If you have a family history of colorectal cancer, talk with your health care provider about genetic screening.  Your health care provider may also recommend using home test kits to check for hidden blood in your stool.  A small camera at the end of a tube can be used to examine your colon directly (sigmoidoscopy or colonoscopy). This is done to check for the earliest forms of colorectal cancer.  Direct examination  of the colon should be repeated every 5-10 years until age 34. However, if early forms of precancerous polyps or small growths are found or if you have a family history or genetic risk for colorectal cancer, you may need to be screened more often. Skin Cancer  Check your skin from head to toe regularly.  Monitor any moles. Be sure to tell your health care provider: ? About any new moles or changes in moles, especially if there is a change in a mole's shape or color. ? If you have a mole that is larger than the size of a pencil eraser.  If any of your family members has a history of skin cancer, especially at a Leocadio Heal age, talk with your health care provider about genetic screening.  Always use sunscreen. Apply sunscreen liberally and repeatedly throughout the day.  Whenever you are outside, protect yourself by wearing long sleeves, pants, a wide-brimmed hat, and sunglasses. What should I know about osteoporosis? Osteoporosis is a condition in which bone destruction happens more quickly than new bone creation. After menopause, you may be at an increased risk for osteoporosis. To help prevent osteoporosis or the bone fractures that can happen because of osteoporosis, the following is recommended:  If you are 12-1 years old, get at least 1,000 mg of calcium and at least 600 mg of vitamin D per day.  If you are older than age 35 but younger than age 44, get at least 1,200 mg of calcium and at least 600 mg of vitamin D per day.  If you are older than age 31, get at least 1,200 mg of calcium and at least 800 mg of vitamin D per day. Smoking and excessive alcohol intake increase the risk of osteoporosis. Eat foods that are rich in calcium and vitamin D, and do weight-bearing exercises several times each week as directed by your health care provider. What should I know about how menopause affects my mental health? Depression may occur at any age, but it is more common as you become older. Common  symptoms of depression include:  Low or sad mood.  Changes in sleep patterns.  Changes in appetite or eating patterns.  Feeling an overall lack of motivation or enjoyment of activities that you previously enjoyed.  Frequent crying spells. Talk with your health care provider if you think that you are experiencing depression. What should I know about immunizations? It is important that you get and maintain your immunizations. These include:  Tetanus, diphtheria, and pertussis (Tdap) booster vaccine.  Influenza every year before the flu season begins.  Pneumonia vaccine.  Shingles vaccine. Your health care provider may also recommend other immunizations. This information is not intended to replace advice given to you by your health care provider. Make sure you discuss any questions you have with your health care provider. Document Released:  12/21/2005 Document Revised: 05/18/2016 Document Reviewed: 08/02/2015 Elsevier Interactive Patient Education  [employer].

## 2019-05-05 LAB — LIPID PANEL
Cholesterol: 188 mg/dL (ref <200)
HDL: 88 mg/dL (ref >=50)
LDL Cholesterol (Calc): 83 mg/dL (calc)
Non-HDL Cholesterol (Calc): 100 mg/dL (calc) (ref <130)
Total CHOL/HDL Ratio: 2.1 (calc) (ref <5.0)
Triglycerides: 76 mg/dL (ref <150)

## 2019-05-05 LAB — CBC WITH DIFFERENTIAL/PLATELET
Absolute Monocytes: 385 cells/uL (ref 200–950)
Basophils Absolute: 49 cells/uL (ref 0–200)
Basophils Relative: 0.7 %
Eosinophils Absolute: 49 cells/uL (ref 15–500)
Eosinophils Relative: 0.7 %
HCT: 43 % (ref 35.0–45.0)
Hemoglobin: 14.6 g/dL (ref 11.7–15.5)
Lymphs Abs: 1897 cells/uL (ref 850–3900)
MCH: 34.4 pg — ABNORMAL HIGH (ref 27.0–33.0)
MCHC: 34 g/dL (ref 32.0–36.0)
MCV: 101.2 fL — ABNORMAL HIGH (ref 80.0–100.0)
MPV: 11.2 fL (ref 7.5–12.5)
Monocytes Relative: 5.5 %
Neutro Abs: 4620 cells/uL (ref 1500–7800)
Neutrophils Relative %: 66 %
Platelets: 194 10*3/uL (ref 140–400)
RBC: 4.25 10*6/uL (ref 3.80–5.10)
RDW: 12.2 % (ref 11.0–15.0)
Total Lymphocyte: 27.1 %
WBC: 7 10*3/uL (ref 3.8–10.8)

## 2019-05-05 LAB — COMPREHENSIVE METABOLIC PANEL
AG Ratio: 1.7 (calc) (ref 1.0–2.5)
ALT: 15 U/L (ref 6–29)
AST: 22 U/L (ref 10–35)
Albumin: 4.5 g/dL (ref 3.6–5.1)
Alkaline phosphatase (APISO): 69 U/L (ref 37–153)
BUN: 13 mg/dL (ref 7–25)
CO2: 24 mmol/L (ref 20–32)
Calcium: 9.9 mg/dL (ref 8.6–10.4)
Chloride: 103 mmol/L (ref 98–110)
Creat: 0.75 mg/dL (ref 0.50–1.05)
Globulin: 2.6 g/dL (calc) (ref 1.9–3.7)
Glucose, Bld: 98 mg/dL (ref 65–99)
Potassium: 3.4 mmol/L — ABNORMAL LOW (ref 3.5–5.3)
Sodium: 140 mmol/L (ref 135–146)
Total Bilirubin: 0.7 mg/dL (ref 0.2–1.2)
Total Protein: 7.1 g/dL (ref 6.1–8.1)

## 2019-05-19 ENCOUNTER — Other Ambulatory Visit: Payer: Self-pay | Admitting: Women's Health

## 2019-05-19 DIAGNOSIS — Z1231 Encounter for screening mammogram for malignant neoplasm of breast: Secondary | ICD-10-CM

## 2019-06-17 ENCOUNTER — Other Ambulatory Visit: Payer: Self-pay

## 2019-06-17 ENCOUNTER — Ambulatory Visit (INDEPENDENT_AMBULATORY_CARE_PROVIDER_SITE_OTHER): Payer: BC Managed Care – PPO | Admitting: Family Medicine

## 2019-06-17 ENCOUNTER — Encounter: Payer: Self-pay | Admitting: Family Medicine

## 2019-06-17 VITALS — BP 124/84 | HR 91 | Temp 98.4°F | Ht 65.25 in | Wt 146.2 lb

## 2019-06-17 DIAGNOSIS — Z87891 Personal history of nicotine dependence: Secondary | ICD-10-CM

## 2019-06-17 DIAGNOSIS — Z808 Family history of malignant neoplasm of other organs or systems: Secondary | ICD-10-CM | POA: Insufficient documentation

## 2019-06-17 DIAGNOSIS — Z7689 Persons encountering health services in other specified circumstances: Secondary | ICD-10-CM

## 2019-06-17 DIAGNOSIS — Z9884 Bariatric surgery status: Secondary | ICD-10-CM | POA: Diagnosis not present

## 2019-06-17 DIAGNOSIS — Z87898 Personal history of other specified conditions: Secondary | ICD-10-CM

## 2019-06-17 DIAGNOSIS — Z8639 Personal history of other endocrine, nutritional and metabolic disease: Secondary | ICD-10-CM

## 2019-06-17 NOTE — Patient Instructions (Signed)

## 2019-06-17 NOTE — Progress Notes (Signed)
New patient office visit note:  Impression and Recommendations:    1. Establishing care with new doctor, encounter for   2. History of laparoscopic adjustable gastric banding   3. History of obesity   4. Family history of thyroid cancer-medullary    5. History of prediabetes   6. History of hyperlipidemia, mixed   7. History of smoking 30 or more pack years Chronic    Encounter to Establish Care with New Provider - Extensive discussion held with patient regarding establishing as a new patient.  Discussed policies and practices here at the clinic, and answered all questions about care team and health management during appointment.  - Discussed need for patient to continue to obtain management and screenings with all established specialists.  Educated patient at length about the critical importance of keeping health maintenance up to date.  - Participated in lengthy conversation and all questions were answered.  H/o 30+ pack-year history of Smoking - Discussed need for follow-up CT lung screening in future   History of Obesity, Laparoscopic Gastric Banding - BMI WNL at 24.14 Explained to patient what BMI refers to, and what it means medically.  Told patient to think about it as a "medical risk stratification measurement" and how increasing BMI is associated with increasing risk/ or worsening state of various diseases such as hypertension, hyperlipidemia, diabetes, premature OA, depression etc.  American Heart Association guidelines for healthy diet, basically Mediterranean diet, and exercise guidelines of 30 minutes 5 days per week or more discussed in detail.  - Encouraged patient to continue with her healthy habits.  Health counseling performed.  All questions answered.   Lifestyle & Preventative Health Maintenance - Advised patient to continue working toward physical conditioning to improve overall mental, physical, and emotional health.    - Reviewed the "spokes of  the wheel" of mood and health management.  Stressed the importance of ongoing prudent habits, including regular exercise, appropriate sleep hygiene, healthful dietary habits, and prayer/meditation to relax.  - Healthy dietary habits encouraged, including low-carb, and high amounts of lean protein in diet.   - Patient should also consume adequate amounts of water.  Recommendations - Need for physical and comprehensive FBW to establish patient baseline.   Education and routine counseling performed. Handouts provided.   Medications Discontinued During This Encounter  Medication Reason  . Multiple Vitamin (MULTIVITAMIN) tablet Patient Preference      Gross side effects, risk and benefits, and alternatives of medications discussed with patient.  Patient is aware that all medications have potential side effects and we are unable to predict every side effect or drug-drug interaction that may occur.  Expresses verbal understanding and consents to current therapy plan and treatment regimen.  Return for CPE/ yrly physical, come fasting.  Please see AVS handed out to patient at the end of our visit for further patient instructions/ counseling done pertaining to today's office visit.    Note:  This document was prepared using Dragon voice recognition software and may include unintentional dictation errors.   This document serves as a record of services personally performed by Thomasene Loteborah Eleora Sutherland, DO. It was created on her behalf by Peggye FothergillKatherine Galloway, a trained medical scribe. The creation of this record is based on the scribe's personal observations and the provider's statements to them.   I have reviewed the above medical documentation for accuracy and completeness and I concur.  Thomasene Loteborah Gisele Pack, DO 06/19/2019 3:27 PM        ---------------------------------------------------------------------------------------------------------------------------------------------------------------------------------------------  Subjective:    Chief complaint:   Chief Complaint  Patient presents with  . Establish Care     HPI: Shelby Martin is a pleasant 48 y.o. female who [facility] today to review their medical history with me and establish care.   I asked the patient to review their chronic problem list with me to ensure everything was updated and accurate.    All recent office visits with other providers, any medical records that patient brought in etc  - I reviewed today.     We asked pt to get us their medical records from [facility] providers/ specialists that they had seen within the past 3-5 years- if they are in private practice and/or do not work for [facility], [facility], [facility], [facility] or [facility] owned practice.  Told them to call their specialists to clarify this if they are not sure.   States her former doctor retired, through [facility].  She cannot remember her former provider's name.  A couple of the patient's friends come here.   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 Husband works "in [employer] warehouse side of things."  Says she "works and that's just about it." Happily married for 30 years. Sexually active with husband.  One child, 50 year old female. He moved down to [place], [place], got a new girlfriend. His girlfriend has a kid, so patient says she's "almost a grandma."  Says she takes care of everybody else except for herself.  Tobacco Use Former smoker, 1 ppd 30+ pack years.  Quit 06/22/2016. Started at age 48.  Alcohol Use Occasionally, no concerns.  Caffeine Use 3 cups of coffee, tea, caffeinated beverages per day.  Exercise Habits Has never really been an "exerciser." States usually doing  outside work around the house. 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 Maternal grandfather had medullary thyroid cancer. Dad had a heart attack.  He passed at age 50.  Denies history of breast or GI cancers.  Says her maternal grandmother had brain cancer and Alzheimer's after that.  No siblings, and state father was adopted.  Past Medical History  - Female Health Established with OBGYN.  - Several Back Surgeries Says L5 ruptured three times, once on the left side, and twice on the right side, "or maybe backwards."  Hasn't had any issues since then, stable.  - Lap Band Surgery in 2010 - Weight Loss of 100 lbs since then. Says "it has been wonderful, that has been the best thing I've ever done."  Notes she had the surgery done in '09.  She was 245 lbs back then and says "that was the best thing I could have ever done."  Her mom had it done on January 5.  Her surgeon was Dr[facility] Daphine [facility]Martin of [facility].  She liked the care she received there.  Denies anemias, deficiencies that she knows of.  She has not been going to a doctor that checks her for these issues.  Says she's having heartburn and thinks "that may be what's causing my heartburn."  No medical history of HTN, cholesterol, diabetes, heart disease.  "I was borderline on everything up until my surgery."   Wt Readings from Last 3 Encounters:  06/17/19 146 lb 3.2 oz (66.3 kg)  05/04/19 140 lb (63.5 kg)  04/28/18 150 lb (68 kg)   BP Readings from Last 3 Encounters:  06/17/19 124/84  05/04/19 130/80  04/28/18 132/80   Pulse Readings from Last 3 Encounters:  06/17/19 91   BMI Readings from Last 3 Encounters:  06/17/19 24.14 kg/m  05/04/19 23.30 kg/m  04/28/18 24.96 kg/m    Patient Care Team    Relationship Specialty Notifications Start End  Thomasene Lot, DO PCP - General  Family Medicine  06/17/19     Patient Active Problem List   Diagnosis Date Noted  . History of smoking 30 or more pack years- quit 8/17 07/11/2019    Priority: High  . Hx of laparoscopic gastric banding 04/28/2018    Priority: Medium  . History of hyperlipidemia, mixed 06/17/2019    Priority: Low  . History of prediabetes 06/17/2019    Priority: Low  . Family history of thyroid cancer-medullary  06/17/2019    Priority: Low  . History of obesity 06/17/2019    Priority: Low  . IUD (intrauterine device) in place 11/08/2016  . Cervical dysplasia        As reported by pt:  Past Medical History:  Diagnosis Date  . Cervical dysplasia      Past Surgical History:  Procedure Laterality Date  . BACK SURGERY    . COLPOSCOPY    . GYNECOLOGIC CRYOSURGERY    . LAPAROSCOPIC GASTRIC BANDING    . Mirena IUD     Inserted 10-23-06     Family History  Problem Relation Age of Onset  . Diabetes Mother   . Multiple sclerosis Mother   . Thyroid cancer Maternal Grandfather   . Cancer Maternal Grandfather        brain   . Heart attack Father   . Breast cancer Neg Hx      Social History   Substance and Sexual Activity  Drug Use No     Social History   Substance and Sexual Activity  Alcohol Use Yes  . Alcohol/week: 1.0 standard drinks  . Types: 1 Standard drinks or equivalent per week   Comment: rare     Social History   Tobacco Use  Smoking Status Former Smoker  . Packs/day: 1.00  . Years: 30.00  . Pack years: 30.00  . Types: Cigarettes  . Quit date: 06/22/2016  . Years since quitting: 3.0  Smokeless Tobacco Never Used     Current Meds  Medication Sig  . Acetaminophen (TYLENOL 8 HOUR PO) Take by mouth.  . Calcium Carbonate-Vitamin D (CALCIUM + D PO) Take by mouth 3 (three) times daily.     Current Facility-Administered Medications for the 06/17/19 encounter (Office Visit) with Thomasene Lot, DO  Medication  . levonorgestrel (MIRENA) 20 MCG/24HR IUD     Allergies: Ibuprofen   Review of Systems  Constitutional: Negative for chills, diaphoresis, fever, malaise/fatigue and weight loss.  HENT: Negative for congestion, sore throat and tinnitus.   Eyes: Negative for blurred vision, double vision and photophobia.  Respiratory: Negative for cough and wheezing.   Cardiovascular: Negative for chest pain and palpitations.  Gastrointestinal: Negative for blood in stool, diarrhea, nausea and vomiting.  Genitourinary: Negative for dysuria, frequency and urgency.  Musculoskeletal: Negative for joint pain and myalgias.  Skin: Negative for itching and rash.  Neurological: Negative for dizziness, focal weakness, weakness and headaches.  Endo/Heme/Allergies: Negative for environmental allergies and polydipsia. Does not bruise/bleed easily.  Psychiatric/Behavioral: Negative for depression and memory loss. Suicidal ideas:  The patient is not nervous/anxious and does not have insomnia.         Objective:   Blood pressure 124/84, pulse 91, temperature 98.4 F (36.9 C), height 5' 5.25" (1.657 m), weight 146 lb 3.2 oz (66.3 kg), SpO2 100 %. Body mass index is 24.14 kg/m. General: Well Developed, well nourished, and in no acute distress.  Neuro: Alert and oriented x3, extra-ocular muscles intact, sensation grossly intact.  HEENT:Belva/AT, PERRLA, neck supple, No carotid bruits Skin: no gross rashes  Cardiac: Regular rate and rhythm Respiratory: Essentially clear to auscultation bilaterally. Not using accessory muscles, speaking in full sentences.  Abdominal: not grossly distended Musculoskeletal: Ambulates w/o diff, FROM * 4 ext.  Vasc: less 2 sec cap RF, warm and pink  Psych:  No HI/SI, judgement and insight good, Euthymic mood. Full Affect.    Recent Results (from the past 2160 hour(s))  CBC with Differential/Platelet     Status: Abnormal   Collection Time: 05/04/19  8:56 AM  Result Value Ref Range   WBC 7.0 3.8 - 10.8 Thousand/uL   RBC 4.25  3.80 - 5.10 Million/uL   Hemoglobin 14.6 11.7 - 15.5 g/dL   HCT 41.343.0 24.435.0 - 01.045.0 %   MCV 101.2 (H) 80.0 - 100.0 fL   MCH 34.4 (H) 27.0 - 33.0 pg   MCHC 34.0 32.0 - 36.0 g/dL   RDW 27.212.2 53.611.0 - 64.415.0 %   Platelets 194 140 - 400 Thousand/uL   MPV 11.2 7.5 - 12.5 fL   Neutro Abs 4,620 1,500 - 7,800 cells/uL   Lymphs Abs 1,897 850 - 3,900 cells/uL   Absolute Monocytes 385 200 - 950 cells/uL   Eosinophils Absolute 49 15 - 500 cells/uL   Basophils Absolute 49 0 - 200 cells/uL   Neutrophils Relative % 66 %   Total Lymphocyte 27.1 %   Monocytes Relative 5.5 %   Eosinophils Relative 0.7 %   Basophils Relative 0.7 %  Comprehensive metabolic panel     Status: Abnormal   Collection Time: 05/04/19  8:56 AM  Result Value Ref Range   Glucose, Bld 98 65 - 99 mg/dL    Comment: .            Fasting reference interval .    BUN 13 7 - 25 mg/dL   Creat 0.340.75 7.420.50 - 5.951.05 mg/dL    Comment: For patients >50 years of age, the reference limit for Creatinine is approximately 13% higher for people identified as African-American. .    BUN/Creatinine Ratio NOT APPLICABLE 6 - 22 (calc)   Sodium 140 135 - 146 mmol/L   Potassium 3.4 (L) 3.5 - 5.3 mmol/L   Chloride 103 98 - 110 mmol/L   CO2 24 20 - 32 mmol/L   Calcium 9.9 8.6 - 10.4 mg/dL   Total Protein 7.1 6.1 - 8.1 g/dL   Albumin 4.5 3.6 - 5.1 g/dL   Globulin 2.6 1.9 - 3.7 g/dL (calc)   AG Ratio 1.7 1.0 - 2.5 (calc)   Total Bilirubin 0.7 0.2 - 1.2 mg/dL   Alkaline phosphatase (APISO) 69 37 - 153 U/L   AST 22 10 - 35 U/L   ALT 15 6 - 29 U/L  Lipid panel     Status: None   Collection Time: 05/04/19  8:56 AM  Result Value Ref Range   Cholesterol 188 <200 mg/dL   HDL 88 > OR = 50 mg/dL   Triglycerides 76 <638<150 mg/dL   LDL Cholesterol (Calc) 83 mg/dL (  calc)    Comment: Reference range: <100 . Desirable range <100 mg/dL for primary prevention;   <70 mg/dL for patients with CHD or diabetic patients  with > or = 2 CHD risk factors. Marland Kitchen LDL-C is now  calculated using the Martin-Hopkins  calculation, which is a validated novel method providing  better accuracy than the Friedewald equation in the  estimation of LDL-C.  Cresenciano Genre et al. Annamaria Helling. 4010;272(53): 2061-2068  (http://education.[facility].com/faq/FAQ164)    Total CHOL/HDL Ratio 2.1 <5.0 (calc)   Non-HDL Cholesterol (Calc) 100 <130 mg/dL (calc)    Comment: For patients with diabetes plus 1 major ASCVD risk  factor, treating to a non-HDL-C goal of <100 mg/dL  (LDL-C of <70 mg/dL) is considered a therapeutic  option.

## 2019-07-11 DIAGNOSIS — Z87891 Personal history of nicotine dependence: Secondary | ICD-10-CM | POA: Insufficient documentation

## 2019-08-05 ENCOUNTER — Encounter: Payer: Self-pay | Admitting: Gastroenterology

## 2019-08-05 ENCOUNTER — Encounter: Payer: Self-pay | Admitting: Family Medicine

## 2019-08-05 ENCOUNTER — Ambulatory Visit (INDEPENDENT_AMBULATORY_CARE_PROVIDER_SITE_OTHER): Payer: BC Managed Care – PPO | Admitting: Family Medicine

## 2019-08-05 ENCOUNTER — Encounter: Payer: Self-pay | Admitting: Gynecology

## 2019-08-05 ENCOUNTER — Other Ambulatory Visit: Payer: Self-pay

## 2019-08-05 VITALS — BP 105/71 | HR 82 | Temp 98.6°F | Ht 65.25 in | Wt 135.2 lb

## 2019-08-05 DIAGNOSIS — Z0000 Encounter for general adult medical examination without abnormal findings: Secondary | ICD-10-CM | POA: Diagnosis not present

## 2019-08-05 DIAGNOSIS — Z23 Encounter for immunization: Secondary | ICD-10-CM

## 2019-08-05 DIAGNOSIS — Z1211 Encounter for screening for malignant neoplasm of colon: Secondary | ICD-10-CM

## 2019-08-05 DIAGNOSIS — Z9884 Bariatric surgery status: Secondary | ICD-10-CM

## 2019-08-05 DIAGNOSIS — Z1159 Encounter for screening for other viral diseases: Secondary | ICD-10-CM

## 2019-08-05 DIAGNOSIS — Z87898 Personal history of other specified conditions: Secondary | ICD-10-CM

## 2019-08-05 DIAGNOSIS — Z114 Encounter for screening for human immunodeficiency virus [HIV]: Secondary | ICD-10-CM

## 2019-08-05 DIAGNOSIS — Z719 Counseling, unspecified: Secondary | ICD-10-CM

## 2019-08-05 NOTE — Progress Notes (Signed)
Impression and Recommendations:    1. Encounter for wellness examination   2. Health education/counseling   3. Need for influenza vaccination   4. Encounter for hepatitis C screening test for low risk patient   5. Screening for HIV (human immunodeficiency virus)   6. Screening for colon cancer   7. Healthcare maintenance   8. History of prediabetes   9. H/O laparoscopic adjustable gastric banding    1) Anticipatory Guidance: Discussed importance of wearing a seatbelt while driving, not texting while driving; sunscreen when outside along with yearly skin surveillance; eating a well balanced and modest diet; physical activity at least 25 minutes per day or 150 min/ week of moderate to intense activity.  -  Discussed importance of monitoring the body for changes, especially monitoring her observed prominence of sublingual and submandibular glands (bilaterally and equal).  Patient knows to watch the glands for growth and return for ultrasound PRN.  - Prudent skin monitoring habits discussed with patient today.  Education provided and all questions answered.  2) Immunizations / Screenings / Labs:  All immunizations and screenings that patient agrees to, are up-to-date per recommendations or will be updated today.  Patient understands the needs for q 52mo dental and yearly vision screens which pt will schedule independently. Obtain CBC, CMP, HgA1c, Lipid panel, TSH and vit D when fasting if not already done recently.   - Need for first colonoscopy.  Education provided and questions answered.  - Per pt next mammogram scheduled for December. - Pap smear last obtained 6 of 2019.  Discussed recommendations.  - Discussed shingles vaccine with patient today.  Education provided and questions answered. - Recommendations discussed with patient.  - Full fasting labs obtained today.  - Need for once-lifetime low risk screening of Hep C and HIV.  Recommended to patient today.  - Need for flu  vaccination.  - Need for low dose CT screen for lung cancer at age 83.  Recommendations discussed.  3) Weight:   Discussed goal of improving nutrient density of diet through increasing intake of fruits and vegetables and decreasing saturated/trans fats, white flour products and refined sugar products.   American Heart Association guidelines for healthy diet, basically Mediterranean diet, and exercise guidelines of 30 minutes 5 days per week or more discussed in detail.  Health counseling performed.  All questions answered.  4) Lifestyle & Preventative Health Maintenance - Advised patient to continue working toward exercising to improve overall mental, physical, and emotional health.    - Reviewed the "spokes of the wheel" of mood and health management.  Stressed the importance of ongoing prudent habits, including regular exercise, appropriate sleep hygiene, healthful dietary habits, and prayer/meditation to relax.  - Encouraged patient to engage in daily physical activity, especially a formal exercise routine.  Recommended that the patient eventually strive for at least 150 minutes of moderate cardiovascular activity per week according to guidelines established by the [facility].   - Healthy dietary habits encouraged, including low-carb, and high amounts of lean protein in diet.   - Patient should also consume adequate amounts of water.  5) Recommendations - Discussed need for patient to continue to obtain chronic management and recommended screenings, here at PCP as well as with all established specialists.  Educated patient at length about the critical importance of keeping health maintenance up to date.  - Participated in lengthy conversation and all questions were answered.   Orders Placed This Encounter  Procedures   Flu Vaccine QUAD  6+ mos PF IM (Fluarix Quad PF)   CBC with Differential/Platelet   Comprehensive metabolic panel   Hemoglobin A1c   Lipid panel   TSH   T4, free     T3   VITAMIN D 25 Hydroxy (Vit-D Deficiency, Fractures)   Hepatitis C antibody   HIV Antibody (routine testing w rflx)   Ambulatory referral to Gastroenterology   Gross side effects, risk and benefits, and alternatives of medications discussed with patient.  Patient is aware that all medications have potential side effects and we are unable to predict every side effect or drug-drug interaction that may occur.  Expresses verbal understanding and consents to current therapy plan and treatment regimen.  F-up preventative CPE in 1 year. F/up sooner for chronic care management as discussed and/or prn.  Please see orders placed and AVS handed out to patient at the end of our visit for further patient instructions/ counseling done pertaining to today's office visit.  This document serves as a record of services personally performed by Thomasene Loteborah Deshonna Trnka, DO. It was created on her behalf by Peggye FothergillKatherine Galloway, a trained medical scribe. The creation of this record is based on the scribe's personal observations and the provider's statements to them.   I have reviewed the above medical documentation for accuracy and completeness and I concur.  Thomasene Loteborah Calvert Charland, DO 08/08/2019 3:41 PM        Subjective:    Chief Complaint  Patient presents with   Annual Exam   CC:   HPI: Shelby Martin is a 48 y.o. female who [facility] a yearly health maintenance exam.  Health Maintenance Summary Reviewed and updated, unless pt declines services.  Colonoscopy:  Will be obtaining first in near future. Denies history of colon cancer or others.   Tobacco History Reviewed:  Y; former smoker, quit in 2017; 30 pack years.  CT scan for screening lung CA:  Need for screening at age [age]. Alcohol:  No concerns, no excessive use Exercise Habits:  Says exercising a "little bit."  Notes feels like all she does is walk at her job.  She isn't sure if she gets 12,000  steps per day. STD concerns:  None. Drug Use:  None. Birth control method:  Mirena. Menses regular:  No concerns. Lumps or breast concerns:  No concerns. Breast Cancer Family History: denies  Denies known history of breast, uterine, or other female cancers.  - Female Health States her pap smears have always been normal.   - Eye Health Pt reports that last eye exam was two months ago. She goes yearly.  - Dental Health Usually goes to the dentist every 6 months.  - Recent Weight Loss & Laparoscopic Gastric Banding Patient has lost over 100 lbs recently.  [facility] her gastric [facility].   Immunization History  Administered Date(s) Administered   Influenza,inj,Quad PF,6+ Mos 08/05/2019    Health Maintenance  Topic Date Due   PAP SMEAR-Modifier  04/29/2019   COLONOSCOPY  06/16/2020 (Originally 03/09/2019)   MAMMOGRAM  10/13/2020   TETANUS/TDAP  03/18/2021   INFLUENZA VACCINE  Completed   HIV Screening  Completed     Wt Readings from Last 3 Encounters:  08/05/19 135 lb 3.2 oz (61.3 kg)  06/17/19 146 lb 3.2 oz (66.3 kg)  05/04/19 140 lb (63.5 kg)   BP Readings from Last 3 Encounters:  08/05/19 105/71  06/17/19 124/84  05/04/19 130/80   Pulse  Readings from Last 3 Encounters:  08/05/19 82  06/17/19 91     Past Medical History:  Diagnosis Date   Cervical dysplasia       Past Surgical History:  Procedure Laterality Date   BACK SURGERY     COLPOSCOPY     GYNECOLOGIC CRYOSURGERY     LAPAROSCOPIC GASTRIC BANDING     Mirena IUD     Inserted 10-23-06      Family History  Problem Relation Age of Onset   Diabetes Mother    Multiple sclerosis Mother    Thyroid cancer Maternal Grandfather    Cancer Maternal Grandfather        brain    Heart attack Father    Breast cancer Neg Hx       Social History   Substance and Sexual Activity  Drug Use No  ,   Social History   Substance and Sexual  Activity  Alcohol Use Yes   Alcohol/week: 1.0 standard drinks   Types: 1 Standard drinks or equivalent per week   Comment: rare  ,   Social History   Tobacco Use  Smoking Status Former Smoker   Packs/day: 1.00   Years: 30.00   Pack years: 30.00   Types: Cigarettes   Quit date: 06/22/2016   Years since quitting: 3.1  Smokeless Tobacco Never Used  ,   Social History   Substance and Sexual Activity  Sexual Activity Yes   Birth control/protection: I.U.D., None   Comment: Mirena inserted 10-30-11, INTERCOURSE AGE 46, SEXUAL PARTNERS MORE THAN 5    Current Outpatient Medications on File Prior to Visit  Medication Sig Dispense Refill   Acetaminophen (TYLENOL 8 HOUR PO) Take by mouth.     Calcium Carbonate-Vitamin D (CALCIUM + D PO) Take by mouth 3 (three) times daily.       Current Facility-Administered Medications on File Prior to Visit  Medication Dose Route Frequency Provider Last Rate Last Dose   levonorgestrel (MIRENA) 20 MCG/24HR IUD   Intrauterine Once Trellis Paganini, MD        Allergies: Ibuprofen  Review of Systems: General:   Denies fever, chills, unexplained weight loss.  Optho/Auditory:   Denies visual changes, blurred vision/LOV Respiratory:   Denies SOB, DOE more than baseline levels.  Cardiovascular:   Denies chest pain, palpitations, new onset peripheral edema  Gastrointestinal:   Denies nausea, vomiting, diarrhea.  Genitourinary: Denies dysuria, freq/ urgency, flank pain or discharge from genitals.  Endocrine:     Denies hot or cold intolerance, polyuria, polydipsia. Musculoskeletal:   Denies unexplained myalgias, joint swelling, unexplained arthralgias, gait problems.  Skin:  Denies rash, suspicious lesions Neurological:     Denies dizziness, unexplained weakness, numbness  Psychiatric/Behavioral:   Denies mood changes, suicidal or homicidal ideations, hallucinations    Objective:    Blood pressure 105/71, pulse 82, temperature 98.6  F (37 C), temperature source Oral, height 5' 5.25" (1.657 m), weight 135 lb 3.2 oz (61.3 kg), SpO2 100 %. Body mass index is 22.33 kg/m. General Appearance:    Alert, cooperative, no distress, appears stated age  Head:    Normocephalic, without obvious abnormality, atraumatic  Eyes:    PERRL, conjunctiva/corneas clear, EOM's intact, fundi    benign, both eyes  Ears:    Normal TM's and external ear canals, both ears  Nose:   Nares normal, septum midline, mucosa normal, no drainage    or sinus tenderness  Throat:   Lips w/o lesion, mucosa moist, and  tongue normal; teeth and   gums normal  Neck:  Prominence of her sublingual and submandibular glands, bilaterally and equal.  Supple, symmetrical, trachea midline, no adenopathy; thyroid:  no enlargement/tenderness/nodules; no carotid   bruit or JVD  Back:     Small lipoma just superior to the right spine of the scapula, freely mobile.  Symmetric, no curvature, ROM normal, no CVA tenderness  Lungs:     Clear to auscultation bilaterally, respirations unlabored, no       Wh/ R/ R  Chest Wall:    No tenderness or gross deformity; normal excursion   Heart:    Regular rate and rhythm, S1 and S2 normal, no murmur, rub   or gallop  Breast Exam:    Deferred to OBGYN.  Abdomen:     Soft, non-tender, bowel sounds active all four quadrants, NO   G/R/R, no masses, no organomegaly  Genitalia:    Deferred to OBGYN.  Rectal:    Deferred to OBGYN & due for colonoscopy this year.  Extremities:   Extremities normal, atraumatic, no cyanosis or gross edema  Pulses:   2+ and symmetric all extremities  Skin:   Warm, dry, Skin color, texture, turgor normal, no obvious rashes or lesions Psych: No HI/SI, judgement and insight good, Euthymic mood. Full Affect.  Neurologic:   CNII-XII intact, normal strength, sensation and reflexes    Throughout

## 2019-08-05 NOTE — Patient Instructions (Signed)
Preventive Care for Adults, Female  A healthy lifestyle and preventive care can promote health and wellness. Preventive health guidelines for women include the following key practices.   A routine yearly physical is a good way to check with your health care provider about your health and preventive screening. It is a chance to share any concerns and updates on your health and to receive a thorough exam.   Visit your dentist for a routine exam and preventive care every 6 months. Brush your teeth twice a day and floss once a day. Good oral hygiene prevents tooth decay and gum disease.   The frequency of eye exams is based on your age, health, family medical history, use of contact lenses, and other factors. Follow your health care provider's recommendations for frequency of eye exams.   Eat a healthy diet. Foods like vegetables, fruits, whole grains, low-fat dairy products, and lean protein foods contain the nutrients you need without too many calories. Decrease your intake of foods high in solid fats, added sugars, and salt. Eat the right amount of calories for you.Get information about a proper diet from your health care provider, if necessary.   Regular physical exercise is one of the most important things you can do for your health. Most adults should get at least 150 minutes of moderate-intensity exercise (any activity that increases your heart rate and causes you to sweat) each week. In addition, most adults need muscle-strengthening exercises on 2 or more days a week.   Maintain a healthy weight. The body mass index (BMI) is a screening tool to identify possible weight problems. It provides an estimate of body fat based on height and weight. Your health care provider can find your BMI, and can help you achieve or maintain a healthy weight.For adults 20 years and older:   - A BMI below 18.5 is considered underweight.   - A BMI of 18.5 to 24.9 is normal.   - A BMI of 25 to 29.9 is  considered overweight.   - A BMI of 30 and above is considered obese.   Maintain normal blood lipids and cholesterol levels by exercising and minimizing your intake of trans and saturated fats.  Eat a balanced diet with plenty of fruit and vegetables. Blood tests for lipids and cholesterol should begin at age 65 and be repeated every 5 years minimum.  If your lipid or cholesterol levels are high, you are over 40, or you are at high risk for heart disease, you may need your cholesterol levels checked more frequently.Ongoing high lipid and cholesterol levels should be treated with medicines if diet and exercise are not working.   If you smoke, find out from your health care provider how to quit. If you do not use tobacco, do not start.   Lung cancer screening is recommended for adults aged 10-80 years who are at high risk for developing lung cancer because of a history of smoking. A yearly low-dose CT scan of the lungs is recommended for people who have at least a 30-pack-year history of smoking and are a current smoker or have quit within the past 15 years. A pack year of smoking is smoking an average of 1 pack of cigarettes a day for 1 year (for example: 1 pack a day for 30 years or 2 packs a day for 15 years). Yearly screening should continue until the smoker has stopped smoking for at least 15 years. Yearly screening should be stopped for people who develop a  health problem that would prevent them from having lung cancer treatment.   If you are pregnant, do not drink alcohol. If you are breastfeeding, be very cautious about drinking alcohol. If you are not pregnant and choose to drink alcohol, do not have more than 1 drink per day. One drink is considered to be 12 ounces (355 mL) of beer, 5 ounces (148 mL) of wine, or 1.5 ounces (44 mL) of liquor.   Avoid use of street drugs. Do not share needles with anyone. Ask for help if you need support or instructions about stopping the use of  drugs.   High blood pressure causes heart disease and increases the risk of stroke. Your blood pressure should be checked at least yearly.  Ongoing high blood pressure should be treated with medicines if weight loss and exercise do not work.   If you are 25-14 years old, ask your health care provider if you should take aspirin to prevent strokes.   Diabetes screening involves taking a blood sample to check your fasting blood sugar level. This should be done once every 3 years, after age 48, if you are within normal weight and without risk factors for diabetes. Testing should be considered at a younger age or be carried out more frequently if you are overweight and have at least 1 risk factor for diabetes.   Breast cancer screening is essential preventive care for women. You should practice "breast self-awareness."  This means understanding the normal appearance and feel of your breasts and may include breast self-examination.  Any changes detected, no matter how small, should be reported to a health care provider.  Women in their 89s and 30s should have a clinical breast exam (CBE) by a health care provider as part of a regular health exam every 1 to 3 years.  After age 63, women should have a CBE every year.  Starting at age 81, women should consider having a mammogram (breast X-ray test) every year.  Women who have a family history of breast cancer should talk to their health care provider about genetic screening.  Women at a high risk of breast cancer should talk to their health care providers about having an MRI and a mammogram every year.   -Breast cancer gene (BRCA)-related cancer risk assessment is recommended for women who have family members with BRCA-related cancers. BRCA-related cancers include breast, ovarian, tubal, and peritoneal cancers. Having family members with these cancers may be associated with an increased risk for harmful changes (mutations) in the breast cancer genes BRCA1 and  BRCA2. Results of the assessment will determine the need for genetic counseling and BRCA1 and BRCA2 testing.   The Pap test is a screening test for cervical cancer. A Pap test can show cell changes on the cervix that might become cervical cancer if left untreated. A Pap test is a procedure in which cells are obtained and examined from the lower end of the uterus (cervix).   - Women should have a Pap test starting at age 13.   - Between ages 51 and 22, Pap tests should be repeated every 2 years.   - Beginning at age 70, you should have a Pap test every 3 years as long as the past 3 Pap tests have been normal.   - Some women have medical problems that increase the chance of getting cervical cancer. Talk to your health care provider about these problems. It is especially important to talk to your health care provider if a  new problem develops soon after your last Pap test. In these cases, your health care provider may recommend more frequent screening and Pap tests.   - The above recommendations are the same for women who have or have not gotten the vaccine for human papillomavirus (HPV).   - If you had a hysterectomy for a problem that was not cancer or a condition that could lead to cancer, then you no longer need Pap tests. Even if you no longer need a Pap test, a regular exam is a good idea to make sure no other problems are starting.   - If you are between ages 8 and 94 years, and you have had normal Pap tests going back 10 years, you no longer need Pap tests. Even if you no longer need a Pap test, a regular exam is a good idea to make sure no other problems are starting.   - If you have had past treatment for cervical cancer or a condition that could lead to cancer, you need Pap tests and screening for cancer for at least 20 years after your treatment.   - If Pap tests have been discontinued, risk factors (such as a new sexual partner) need to be reassessed to determine if screening should  be resumed.   - The HPV test is an additional test that may be used for cervical cancer screening. The HPV test looks for the virus that can cause the cell changes on the cervix. The cells collected during the Pap test can be tested for HPV. The HPV test could be used to screen women aged 9 years and older, and should be used in women of any age who have unclear Pap test results. After the age of 48, women should have HPV testing at the same frequency as a Pap test.   Colorectal cancer can be detected and often prevented. Most routine colorectal cancer screening begins at the age of 94 years and continues through age 56 years. However, your health care provider may recommend screening at an earlier age if you have risk factors for colon cancer. On a yearly basis, your health care provider may provide home test kits to check for hidden blood in the stool.  Use of a small camera at the end of a tube, to directly examine the colon (sigmoidoscopy or colonoscopy), can detect the earliest forms of colorectal cancer. Talk to your health care provider about this at age 47, when routine screening begins. Direct exam of the colon should be repeated every 5 -10 years through age 65 years, unless early forms of pre-cancerous polyps or small growths are found.   People who are at an increased risk for hepatitis B should be screened for this virus. You are considered at high risk for hepatitis B if:  -You were born in a country where hepatitis B occurs often. Talk with your health care provider about which countries are considered high risk.  - Your parents were born in a high-risk country and you have not received a shot to protect against hepatitis B (hepatitis B vaccine).  - You have HIV or AIDS.  - You use needles to inject street drugs.  - You live with, or have sex with, someone who has Hepatitis B.  - You get hemodialysis treatment.  - You take certain medicines for conditions like cancer, organ  transplantation, and autoimmune conditions.   Hepatitis C blood testing is recommended for all people born from [birth year] through 1965 and any individual  with known risks for hepatitis C.   Practice safe sex. Use condoms and avoid high-risk sexual practices to reduce the spread of sexually transmitted infections (STIs). STIs include gonorrhea, chlamydia, syphilis, trichomonas, herpes, HPV, and human immunodeficiency virus (HIV). Herpes, HIV, and HPV are viral illnesses that have no cure. They can result in disability, cancer, and death. Sexually active women aged 17 years and younger should be checked for chlamydia. Older women with new or multiple partners should also be tested for chlamydia. Testing for other STIs is recommended if you are sexually active and at increased risk.   Osteoporosis is a disease in which the bones lose minerals and strength with aging. This can result in serious bone fractures or breaks. The risk of osteoporosis can be identified using a bone density scan. Women ages 38 years and over and women at risk for fractures or osteoporosis should discuss screening with their health care providers. Ask your health care provider whether you should take a calcium supplement or vitamin D to There are also several preventive steps women can take to avoid osteoporosis and resulting fractures or to keep osteoporosis from worsening. -->Recommendations include:  Eat a balanced diet high in fruits, vegetables, calcium, and vitamins.  Get enough calcium. The recommended total intake of is 1,200 mg daily; for best absorption, if taking supplements, divide doses into 250-500 mg doses throughout the day. Of the two types of calcium, calcium carbonate is best absorbed when taken with food but calcium citrate can be taken on an empty stomach.  Get enough vitamin D. [employer] and the [place] recommend at least 1,000 IU per day for women age 28 and over who are at risk of vitamin D  deficiency. Vitamin D deficiency can be caused by inadequate sun exposure (for example, those who live in [place]).  Avoid alcohol and smoking. Heavy alcohol intake (more than 7 drinks per week) increases the risk of falls and hip fracture and women smokers tend to lose bone more rapidly and have lower bone mass than nonsmokers. Stopping smoking is one of the most important changes women can make to improve their health and decrease risk for disease.  Be physically active every day. Weight-bearing exercise (for example, fast walking, hiking, jogging, and weight training) may strengthen bones or slow the rate of bone loss that comes with aging. Balancing and muscle-strengthening exercises can reduce the risk of falling and fracture.  Consider therapeutic medications. Currently, several types of effective drugs are available. Healthcare providers can recommend the type most appropriate for each woman.  Eliminate environmental factors that may contribute to accidents. Falls cause nearly 90% of all osteoporotic fractures, so reducing this risk is an important bone-health strategy. Measures include ample lighting, removing obstructions to walking, using nonskid rugs on floors, and placing mats and/or grab bars in showers.  Be aware of medication side effects. Some common medicines make bones weaker. These include a type of steroid drug called glucocorticoids used for arthritis and asthma, some antiseizure drugs, certain sleeping pills, treatments for endometriosis, and some cancer drugs. An overactive thyroid gland or using too much thyroid hormone for an underactive thyroid can also be a problem. If you are taking these medicines, talk to your doctor about what you can do to help protect your bones.reduce the rate of osteoporosis.    Menopause can be associated with physical symptoms and risks. Hormone replacement therapy is available to decrease symptoms and risks. You should talk to your  health care provider  about whether hormone replacement therapy is right for you.   Use sunscreen. Apply sunscreen liberally and repeatedly throughout the day. You should seek shade when your shadow is shorter than you. Protect yourself by wearing long sleeves, pants, a wide-brimmed hat, and sunglasses year round, whenever you are outdoors.   Once a month, do a whole body skin exam, using a mirror to look at the skin on your back. Tell your health care provider of new moles, moles that have irregular borders, moles that are larger than a pencil eraser, or moles that have changed in shape or color.   -Stay current with required vaccines (immunizations).   Influenza vaccine. All adults should be immunized every year.  Tetanus, diphtheria, and acellular pertussis (Td, Tdap) vaccine. Pregnant women should receive 1 dose of Tdap vaccine during each pregnancy. The dose should be obtained regardless of the length of time since the last dose. Immunization is preferred during the 27th 36th week of gestation. An adult who has not previously received Tdap or who does not know her vaccine status should receive 1 dose of Tdap. This initial dose should be followed by tetanus and diphtheria toxoids (Td) booster doses every 10 years. Adults with an unknown or incomplete history of completing a 3-dose immunization series with Td-containing vaccines should begin or complete a primary immunization series including a Tdap dose. Adults should receive a Td booster every 10 years.  Varicella vaccine. An adult without evidence of immunity to varicella should receive 2 doses or a second dose if she has previously received 1 dose. Pregnant females who do not have evidence of immunity should receive the first dose after pregnancy. This first dose should be obtained before leaving the health care facility. The second dose should be obtained 4 8 weeks after the first dose.  Human papillomavirus (HPV) vaccine. Females aged 13 26  years who have not received the vaccine previously should obtain the 3-dose series. The vaccine is not recommended for use in pregnant females. However, pregnancy testing is not needed before receiving a dose. If a female is found to be pregnant after receiving a dose, no treatment is needed. In that case, the remaining doses should be delayed until after the pregnancy. Immunization is recommended for any person with an immunocompromised condition through the age of 26 years if she did not get any or all doses earlier. During the 3-dose series, the second dose should be obtained 4 8 weeks after the first dose. The third dose should be obtained 24 weeks after the first dose and 16 weeks after the second dose.  Zoster vaccine. One dose is recommended for adults aged 60 years or older unless certain conditions are present.  Measles, mumps, and rubella (MMR) vaccine. Adults born before 1957 generally are considered immune to measles and mumps. Adults born in 1957 or later should have 1 or more doses of MMR vaccine unless there is a contraindication to the vaccine or there is laboratory evidence of immunity to each of the three diseases. A routine second dose of MMR vaccine should be obtained at least 28 days after the first dose for students attending postsecondary schools, health care workers, or international travelers. People who received inactivated measles vaccine or an unknown type of measles vaccine during 1963 1967 should receive 2 doses of MMR vaccine. People who received inactivated mumps vaccine or an unknown type of mumps vaccine before 1979 and are at high risk for mumps infection should consider immunization with 2 doses of   MMR vaccine. For females of childbearing age, rubella immunity should be determined. If there is no evidence of immunity, females who are not pregnant should be vaccinated. If there is no evidence of immunity, females who are pregnant should delay immunization until after pregnancy.  Unvaccinated health care workers born before 1957 who lack laboratory evidence of measles, mumps, or rubella immunity or laboratory confirmation of disease should consider measles and mumps immunization with 2 doses of MMR vaccine or rubella immunization with 1 dose of MMR vaccine.  Pneumococcal 13-valent conjugate (PCV13) vaccine. When indicated, a person who is uncertain of her immunization history and has no record of immunization should receive the PCV13 vaccine. An adult aged 19 years or older who has certain medical conditions and has not been previously immunized should receive 1 dose of PCV13 vaccine. This PCV13 should be followed with a dose of pneumococcal polysaccharide (PPSV23) vaccine. The PPSV23 vaccine dose should be obtained at least 8 weeks after the dose of PCV13 vaccine. An adult aged 19 years or older who has certain medical conditions and previously received 1 or more doses of PPSV23 vaccine should receive 1 dose of PCV13. The PCV13 vaccine dose should be obtained 1 or more years after the last PPSV23 vaccine dose.  Pneumococcal polysaccharide (PPSV23) vaccine. When PCV13 is also indicated, PCV13 should be obtained first. All adults aged 65 years and older should be immunized. An adult younger than age 65 years who has certain medical conditions should be immunized. Any person who resides in a nursing home or long-term care facility should be immunized. An adult smoker should be immunized. People with an immunocompromised condition and certain other conditions should receive both PCV13 and PPSV23 vaccines. People with human immunodeficiency virus (HIV) infection should be immunized as soon as possible after diagnosis. Immunization during chemotherapy or radiation therapy should be avoided. Routine use of PPSV23 vaccine is not recommended for American Indians, [place] Natives, or people younger than 65 years unless there are medical conditions that require PPSV23 vaccine. When indicated,  people who have unknown immunization and have no record of immunization should receive PPSV23 vaccine. One-time revaccination 5 years after the first dose of PPSV23 is recommended for people aged 19 64 years who have chronic kidney failure, nephrotic syndrome, asplenia, or immunocompromised conditions. People who received 1 2 doses of PPSV23 before age 65 years should receive another dose of PPSV23 vaccine at age 65 years or later if at least 5 years have passed since the previous dose. Doses of PPSV23 are not needed for people immunized with PPSV23 at or after age 65 years.  Meningococcal vaccine. Adults with asplenia or persistent complement component deficiencies should receive 2 doses of quadrivalent meningococcal conjugate (MenACWY-D) vaccine. The doses should be obtained at least 2 months apart. Microbiologists working with certain meningococcal bacteria, military recruits, people at risk during an outbreak, and people who travel to or live in countries with a high rate of meningitis should be immunized. A first-year college student up through age 21 years who is living in a residence hall should receive a dose if she did not receive a dose on or after her 16th birthday. Adults who have certain high-risk conditions should receive one or more doses of vaccine.  Hepatitis A vaccine. Adults who wish to be protected from this disease, have certain high-risk conditions, work with hepatitis A-infected animals, work in hepatitis A research labs, or travel to or work in countries with a high rate of hepatitis A should be   immunized. Adults who were previously unvaccinated and who anticipate close contact with an international adoptee during the first 60 days after arrival in the [place] from a country with a high rate of hepatitis A should be immunized.  Hepatitis B vaccine.  Adults who wish to be protected from this disease, have certain high-risk conditions, may be exposed to blood or other infectious  body fluids, are household contacts or sex partners of hepatitis B positive people, are clients or workers in certain care facilities, or travel to or work in countries with a high rate of hepatitis B should be immunized.  Haemophilus influenzae type b (Hib) vaccine. A previously unvaccinated person with asplenia or sickle cell disease or having a scheduled splenectomy should receive 1 dose of Hib vaccine. Regardless of previous immunization, a recipient of a hematopoietic stem cell transplant should receive a 3-dose series 6 12 months after her successful transplant. Hib vaccine is not recommended for adults with HIV infection.  Preventive Services / Frequency Ages 19 to 39years  Blood pressure check.** / Every 1 to 2 years.  Lipid and cholesterol check.** / Every 5 years beginning at age 20.  Clinical breast exam.** / Every 3 years for women in their 20s and 30s.  BRCA-related cancer risk assessment.** / For women who have family members with a BRCA-related cancer (breast, ovarian, tubal, or peritoneal cancers).  Pap test.** / Every 2 years from ages 21 through 29. Every 3 years starting at age 30 through age 65 or 70 with a history of 3 consecutive normal Pap tests.  HPV screening.** / Every 3 years from ages 30 through ages 65 to 70 with a history of 3 consecutive normal Pap tests.  Hepatitis C blood test.** / For any individual with known risks for hepatitis C.  Skin self-exam. / Monthly.  Influenza vaccine. / Every year.  Tetanus, diphtheria, and acellular pertussis (Tdap, Td) vaccine.** / Consult your health care provider. Pregnant women should receive 1 dose of Tdap vaccine during each pregnancy. 1 dose of Td every 10 years.  Varicella vaccine.** / Consult your health care provider. Pregnant females who do not have evidence of immunity should receive the first dose after pregnancy.  HPV vaccine. / 3 doses over 6 months, if 26 and younger. The vaccine is not recommended for use in  pregnant females. However, pregnancy testing is not needed before receiving a dose.  Measles, mumps, rubella (MMR) vaccine.** / You need at least 1 dose of MMR if you were born in 1957 or later. You may also need a 2nd dose. For females of childbearing age, rubella immunity should be determined. If there is no evidence of immunity, females who are not pregnant should be vaccinated. If there is no evidence of immunity, females who are pregnant should delay immunization until after pregnancy.  Pneumococcal 13-valent conjugate (PCV13) vaccine.** / Consult your health care provider.  Pneumococcal polysaccharide (PPSV23) vaccine.** / 1 to 2 doses if you smoke cigarettes or if you have certain conditions.  Meningococcal vaccine.** / 1 dose if you are age 19 to 21 years and a first-year college student living in a residence hall, or have one of several medical conditions, you need to get vaccinated against meningococcal disease. You may also need additional booster doses.  Hepatitis A vaccine.** / Consult your health care provider.  Hepatitis B vaccine.** / Consult your health care provider.  Haemophilus influenzae type b (Hib) vaccine.** / Consult your health care provider.  Ages 40 to 64years    Blood pressure check.** / Every 1 to 2 years.  Lipid and cholesterol check.** / Every 5 years beginning at age 22 years.  Lung cancer screening. / Every year if you are aged 81 80 years and have a 30-pack-year history of smoking and currently smoke or have quit within the past 15 years. Yearly screening is stopped once you have quit smoking for at least 15 years or develop a health problem that would prevent you from having lung cancer treatment.  Clinical breast exam.** / Every year after age 100 years.  BRCA-related cancer risk assessment.** / For women who have family members with a BRCA-related cancer (breast, ovarian, tubal, or peritoneal cancers).  Mammogram.** / Every year beginning at age 74  years and continuing for as long as you are in good health. Consult with your health care provider.  Pap test.** / Every 3 years starting at age 28 years through age 30 or 38 years with a history of 3 consecutive normal Pap tests.  HPV screening.** / Every 3 years from ages 85 years through ages 91 to 58 years with a history of 3 consecutive normal Pap tests.  Fecal occult blood test (FOBT) of stool. / Every year beginning at age 60 years and continuing until age 55 years. You may not need to do this test if you get a colonoscopy every 10 years.  Flexible sigmoidoscopy or colonoscopy.** / Every 5 years for a flexible sigmoidoscopy or every 10 years for a colonoscopy beginning at age 3 years and continuing until age 56 years.  Hepatitis C blood test.** / For all people born from [birth year] through 1965 and any individual with known risks for hepatitis C.  Skin self-exam. / Monthly.  Influenza vaccine. / Every year.  Tetanus, diphtheria, and acellular pertussis (Tdap/Td) vaccine.** / Consult your health care provider. Pregnant women should receive 1 dose of Tdap vaccine during each pregnancy. 1 dose of Td every 10 years.  Varicella vaccine.** / Consult your health care provider. Pregnant females who do not have evidence of immunity should receive the first dose after pregnancy.  Zoster vaccine.** / 1 dose for adults aged 50 years or older.  Measles, mumps, rubella (MMR) vaccine.** / You need at least 1 dose of MMR if you were born in 1957 or later. You may also need a 2nd dose. For females of childbearing age, rubella immunity should be determined. If there is no evidence of immunity, females who are not pregnant should be vaccinated. If there is no evidence of immunity, females who are pregnant should delay immunization until after pregnancy.  Pneumococcal 13-valent conjugate (PCV13) vaccine.** / Consult your health care provider.  Pneumococcal polysaccharide (PPSV23) vaccine.** / 1 to 2 doses if  you smoke cigarettes or if you have certain conditions.  Meningococcal vaccine.** / Consult your health care provider.  Hepatitis A vaccine.** / Consult your health care provider.  Hepatitis B vaccine.** / Consult your health care provider.  Haemophilus influenzae type b (Hib) vaccine.** / Consult your health care provider.  Ages 47 years and over  Blood pressure check.** / Every 1 to 2 years.  Lipid and cholesterol check.** / Every 5 years beginning at age 55 years.  Lung cancer screening. / Every year if you are aged 57 80 years and have a 30-pack-year history of smoking and currently smoke or have quit within the past 15 years. Yearly screening is stopped once you have quit smoking for at least 15 years or develop a health problem that  would prevent you from having lung cancer treatment.  Clinical breast exam.** / Every year after age 68 years.  BRCA-related cancer risk assessment.** / For women who have family members with a BRCA-related cancer (breast, ovarian, tubal, or peritoneal cancers).  Mammogram.** / Every year beginning at age 47 years and continuing for as long as you are in good health. Consult with your health care provider.  Pap test.** / Every 3 years starting at age 59 years through age 26 or 26 years with 3 consecutive normal Pap tests. Testing can be stopped between 65 and 70 years with 3 consecutive normal Pap tests and no abnormal Pap or HPV tests in the past 10 years.  HPV screening.** / Every 3 years from ages 71 years through ages 19 or 96 years with a history of 3 consecutive normal Pap tests. Testing can be stopped between 65 and 70 years with 3 consecutive normal Pap tests and no abnormal Pap or HPV tests in the past 10 years.  Fecal occult blood test (FOBT) of stool. / Every year beginning at age 59 years and continuing until age 7 years. You may not need to do this test if you get a colonoscopy every 10 years.  Flexible sigmoidoscopy or colonoscopy.** /  Every 5 years for a flexible sigmoidoscopy or every 10 years for a colonoscopy beginning at age 28 years and continuing until age 27 years.  Hepatitis C blood test.** / For all people born from [birth year] through 1965 and any individual with known risks for hepatitis C.  Osteoporosis screening.** / A one-time screening for women ages 6 years and over and women at risk for fractures or osteoporosis.  Skin self-exam. / Monthly.  Influenza vaccine. / Every year.  Tetanus, diphtheria, and acellular pertussis (Tdap/Td) vaccine.** / 1 dose of Td every 10 years.  Varicella vaccine.** / Consult your health care provider.  Zoster vaccine.** / 1 dose for adults aged 20 years or older.  Pneumococcal 13-valent conjugate (PCV13) vaccine.** / Consult your health care provider.  Pneumococcal polysaccharide (PPSV23) vaccine.** / 1 dose for all adults aged 95 years and older.  Meningococcal vaccine.** / Consult your health care provider.  Hepatitis A vaccine.** / Consult your health care provider.  Hepatitis B vaccine.** / Consult your health care provider.  Haemophilus influenzae type b (Hib) vaccine.** / Consult your health care provider. ** Family history and personal history of risk and conditions may change your health care provider's recommendations. Document Released: 12/25/2001 Document Revised: 08/19/2013  [facility] Patient Information 2014 [place], [place].   EXERCISE AND DIET:  We recommended that you start or continue a regular exercise program for good health. Regular exercise means any activity that makes your heart beat faster and makes you sweat.  We recommend exercising at least 30 minutes per day at least 3 days a week, preferably 5.  We also recommend a diet low in fat and sugar / carbohydrates.  Inactivity, poor dietary choices and obesity can cause diabetes, heart attack, stroke, and kidney damage, among others.     ALCOHOL AND SMOKING:  Women should limit their alcohol intake to no  more than 7 drinks/beers/glasses of wine (combined, not each!) per week. Moderation of alcohol intake to this level decreases your risk of breast cancer and liver damage.  ( And of course, no recreational drugs are part of a healthy lifestyle.)  Also, you should not be smoking at all or even being exposed to second hand smoke. Most people know smoking can  cause cancer, and various heart and lung diseases, but did you know it also contributes to weakening of your bones?  Aging of your skin?  Yellowing of your teeth and nails?   CALCIUM AND VITAMIN D:  Adequate intake of calcium and Vitamin D are recommended.  The recommendations for exact amounts of these supplements seem to change often, but generally speaking 600 mg of calcium (either carbonate or citrate) and 800 units of Vitamin D per day seems prudent. Certain women may benefit from higher intake of Vitamin D.  If you are among these women, your doctor will have told you during your visit.     PAP SMEARS:  Pap smears, to check for cervical cancer or precancers,  have traditionally been done yearly, although recent scientific advances have shown that most women can have pap smears less often.  However, every woman still should have a physical exam from her gynecologist or primary care physician every year. It will include a breast check, inspection of the vulva and vagina to check for abnormal growths or skin changes, a visual exam of the cervix, and then an exam to evaluate the size and shape of the uterus and ovaries.  And after 50 years of age, a rectal exam is indicated to check for rectal cancers. We will also provide age appropriate advice regarding health maintenance, like when you should have certain vaccines, screening for sexually transmitted diseases, bone density testing, colonoscopy, mammograms, etc.    MAMMOGRAMS:  All women over 40 years old should have a yearly mammogram. Many facilities now offer a "3D" mammogram, which may cost  around $50 extra out of pocket. If possible,  we recommend you accept the option to have the 3D mammogram performed.  It both reduces the number of women who will be called back for extra views which then turn out to be normal, and it is better than the routine mammogram at detecting truly abnormal areas.     COLONOSCOPY:  Colonoscopy to screen for colon cancer is recommended for all women at age 27.  We know, you hate the idea of the prep.  We agree, BUT, having colon cancer and not knowing it is worse!!  Colon cancer so often starts as a polyp that can be seen and removed at colonscopy, which can quite literally save your life!  And if your first colonoscopy is normal and you have no family history of colon cancer, most women don't have to have it again for 10 years.  Once every ten years, you can do something that may end up saving your life, right?  We will be happy to help you get it scheduled when you are ready.  Be sure to check your insurance coverage so you understand how much it will cost.  It may be covered as a preventative service at no cost, but you should check your particular policy.

## 2019-08-06 LAB — CBC WITH DIFFERENTIAL/PLATELET
Basophils Absolute: 0.1 10*3/uL (ref 0.0–0.2)
Basos: 1 %
EOS (ABSOLUTE): 0.1 10*3/uL (ref 0.0–0.4)
Eos: 2 %
Hematocrit: 41.5 % (ref 34.0–46.6)
Hemoglobin: 14 g/dL (ref 11.1–15.9)
Immature Grans (Abs): 0 10*3/uL (ref 0.0–0.1)
Immature Granulocytes: 0 %
Lymphocytes Absolute: 1.5 10*3/uL (ref 0.7–3.1)
Lymphs: 25 %
MCH: 33.6 pg — ABNORMAL HIGH (ref 26.6–33.0)
MCHC: 33.7 g/dL (ref 31.5–35.7)
MCV: 100 fL — ABNORMAL HIGH (ref 79–97)
Monocytes Absolute: 0.3 10*3/uL (ref 0.1–0.9)
Monocytes: 5 %
Neutrophils Absolute: 4 10*3/uL (ref 1.4–7.0)
Neutrophils: 67 %
Platelets: 191 10*3/uL (ref 150–450)
RBC: 4.17 x10E6/uL (ref 3.77–5.28)
RDW: 12 % (ref 11.7–15.4)
WBC: 6 10*3/uL (ref 3.4–10.8)

## 2019-08-06 LAB — LIPID PANEL
Chol/HDL Ratio: 2.3 ratio (ref 0.0–4.4)
Cholesterol, Total: 193 mg/dL (ref 100–199)
HDL: 85 mg/dL (ref >39)
LDL Chol Calc (NIH): 94 mg/dL (ref 0–99)
Triglycerides: 75 mg/dL (ref 0–149)
VLDL Cholesterol Cal: 14 mg/dL (ref 5–40)

## 2019-08-06 LAB — COMPREHENSIVE METABOLIC PANEL
ALT: 14 IU/L (ref 0–32)
AST: 21 IU/L (ref 0–40)
Albumin/Globulin Ratio: 1.6 (ref 1.2–2.2)
Albumin: 4.4 g/dL (ref 3.8–4.8)
Alkaline Phosphatase: 74 IU/L (ref 39–117)
BUN/Creatinine Ratio: 13 (ref 9–23)
BUN: 8 mg/dL (ref 6–24)
Bilirubin Total: 0.4 mg/dL (ref 0.0–1.2)
CO2: 23 mmol/L (ref 20–29)
Calcium: 9.4 mg/dL (ref 8.7–10.2)
Chloride: 106 mmol/L (ref 96–106)
Creatinine, Ser: 0.62 mg/dL (ref 0.57–1.00)
GFR calc Af Amer: 122 mL/min/{1.73_m2} (ref >59)
GFR calc non Af Amer: 105 mL/min/{1.73_m2} (ref >59)
Globulin, Total: 2.7 g/dL (ref 1.5–4.5)
Glucose: 87 mg/dL (ref 65–99)
Potassium: 4.6 mmol/L (ref 3.5–5.2)
Sodium: 144 mmol/L (ref 134–144)
Total Protein: 7.1 g/dL (ref 6.0–8.5)

## 2019-08-06 LAB — HEMOGLOBIN A1C
Est. average glucose Bld gHb Est-mCnc: 105 mg/dL
Hgb A1c MFr Bld: 5.3 % (ref 4.8–5.6)

## 2019-08-06 LAB — VITAMIN D 25 HYDROXY (VIT D DEFICIENCY, FRACTURES): Vit D, 25-Hydroxy: 38.7 ng/mL (ref 30.0–100.0)

## 2019-08-06 LAB — T4, FREE: Free T4: 1.2 ng/dL (ref 0.82–1.77)

## 2019-08-06 LAB — HEPATITIS C ANTIBODY: Hep C Virus Ab: <0.1 s/co ratio (ref 0.0–0.9)

## 2019-08-06 LAB — T3: T3, Total: 98 ng/dL (ref 71–180)

## 2019-08-06 LAB — HIV ANTIBODY (ROUTINE TESTING W REFLEX): HIV Screen 4th Generation wRfx: NONREACTIVE

## 2019-08-06 LAB — TSH: TSH: 1.5 u[IU]/mL (ref 0.450–4.500)

## 2019-08-10 ENCOUNTER — Encounter: Payer: Self-pay | Admitting: Gastroenterology

## 2019-08-28 ENCOUNTER — Encounter: Payer: BC Managed Care – PPO | Admitting: Gastroenterology

## 2019-09-09 ENCOUNTER — Ambulatory Visit (AMBULATORY_SURGERY_CENTER): Payer: Self-pay | Admitting: *Deleted

## 2019-09-09 ENCOUNTER — Other Ambulatory Visit: Payer: Self-pay

## 2019-09-09 VITALS — Temp 97.3°F | Ht 65.0 in | Wt 138.0 lb

## 2019-09-09 DIAGNOSIS — Z1211 Encounter for screening for malignant neoplasm of colon: Secondary | ICD-10-CM

## 2019-09-09 MED ORDER — SUPREP BOWEL PREP KIT 17.5-3.13-1.6 GM/177ML PO SOLN: 1.0000 | Freq: Once | ORAL | 0 refills | Status: AC

## 2019-09-09 NOTE — Progress Notes (Signed)
No egg or soy allergy known to patient  No issues with past sedation with any surgeries  or procedures, no intubation problems  No diet pills per patient No home 02 use per patient  No blood thinners per patient  Pt has some issues with constipation her entire life, PATIENT WILL TAKE A DOSE OF MIRALAX EVERY DAY STARTING 09/18/2019 UNTIL 09/23/2019. PATIENT AWARE TO CALL IF PREP IS NOT [facility] PATIENT HAS A LAPBAND AND STATES SHE WIL BE ABLE TO DRINK THE VOLUME REQUIRED FOR SUPREP No A fib or A flutter   EMMI INFORMATION GIVEN  Due to the COVID-19 pandemic we are asking patients to follow these guidelines. Please only bring one care partner. Please be aware that your care partner may wait in the car in the parking lot or if they feel like they will be too hot to wait in the car, they may wait in the lobby on the 4th floor. All care partners are required to wear a mask the entire time (we do not have any that we can provide them), they need to practice social distancing, and we will do a Covid check for all patient's and care partners when you arrive. Also we will check their temperature and your temperature. If the care partner waits in their car they need to stay in the parking lot the entire time and we will call them on their cell phone when the patient is ready for discharge so they can bring the car to the front of the building. Also all patient's will need to wear a mask into building.

## 2019-09-10 ENCOUNTER — Encounter: Payer: Self-pay | Admitting: Gastroenterology

## 2019-09-23 ENCOUNTER — Encounter: Payer: BC Managed Care – PPO | Admitting: Gastroenterology

## 2019-10-16 ENCOUNTER — Ambulatory Visit
Admission: RE | Admit: 2019-10-16 | Discharge: 2019-10-16 | Disposition: A | Discharge status: Home / Self Care | Payer: BC Managed Care – PPO | Source: Ambulatory Visit | Attending: Women's Health | Admitting: Women's Health

## 2019-10-16 ENCOUNTER — Other Ambulatory Visit: Payer: Self-pay

## 2019-10-16 DIAGNOSIS — Z1231 Encounter for screening mammogram for malignant neoplasm of breast: Secondary | ICD-10-CM

## 2020-05-04 ENCOUNTER — Encounter: Payer: BC Managed Care – PPO | Admitting: Nurse Practitioner

## 2020-05-11 ENCOUNTER — Other Ambulatory Visit: Payer: Self-pay

## 2020-05-11 ENCOUNTER — Ambulatory Visit (INDEPENDENT_AMBULATORY_CARE_PROVIDER_SITE_OTHER): Payer: BC Managed Care – PPO | Admitting: Nurse Practitioner

## 2020-05-11 ENCOUNTER — Encounter: Payer: Self-pay | Admitting: Nurse Practitioner

## 2020-05-11 VITALS — BP 122/82 | Ht 66.0 in | Wt 189.0 lb

## 2020-05-11 DIAGNOSIS — Z975 Presence of (intrauterine) contraceptive device: Secondary | ICD-10-CM | POA: Diagnosis not present

## 2020-05-11 DIAGNOSIS — R61 Generalized hyperhidrosis: Secondary | ICD-10-CM

## 2020-05-11 DIAGNOSIS — Z01419 Encounter for gynecological examination (general) (routine) without abnormal findings: Secondary | ICD-10-CM

## 2020-05-11 NOTE — Progress Notes (Signed)
   RONIE FLEEGER Aug 03, 1971 [id number]   History:  49 y.o. G2 P1 presents for annual exam. Amenorrheic/Mirena IUD placed 10/2016. Complains of some night sweats. Abnormal pap greater than 20 years ago, subsequent paps normal. Normal mammogram history. 2011 gastric band, has gained 40 pounds since December after having band loosened and being able to consume more food.   Gynecologic History No LMP recorded. (Menstrual status: IUD).   Contraception: IUD Last Pap: 04/28/2018. Results were: normal Last mammogram: 10/16/2019. Results were: normal Last colonoscopy: never Last Dexa: 07/21/2009. Results were: normal-done for gastric sx   Past medical history, past surgical history, family history and social history were all reviewed and documented in the EPIC chart.  ROS:  A ROS was performed and pertinent positives and negatives are included.  Exam:  Vitals:   05/11/20 1132  BP: 122/82  Weight: 189 lb (85.7 kg)  Height: 5\' 6"  (1.676 m)   Body mass index is 30.51 kg/m.  General appearance:  Normal Thyroid:  Symmetrical, normal in size, without palpable masses or nodularity. Respiratory  Auscultation:  Clear without wheezing or rhonchi Cardiovascular  Auscultation:  Regular rate, without rubs, murmurs or gallops  Edema/varicosities:  Not grossly evident Abdominal  Soft,nontender, without masses, guarding or rebound.  Liver/spleen:  No organomegaly noted  Hernia:  None appreciated  Skin  Inspection:  Grossly normal   Breasts: Examined lying and sitting.   Right: Without masses, retractions, discharge or axillary adenopathy.   Left: Without masses, retractions, discharge or axillary adenopathy. Gentitourinary   Inguinal/mons:  Normal without inguinal adenopathy  External genitalia:  Normal  BUS/Urethra/Skene's glands:  Normal  Vagina:  Normal  Cervix:  Normal, IUD visible in os  Uterus:  Anteverted, normal in size, shape and contour.  Midline and mobile  Adnexa/parametria:       Rt: Without masses or tenderness.   Lt: Without masses or tenderness.  Anus and perineum: Normal  Digital rectal exam: Normal sphincter tone without palpated masses or tenderness  Assessment/Plan:  49 y.o. G2 P1 for annual exam.  Well woman exam with routine gynecological exam - Education provided on SBEs, importance of preventative screenings, current guidelines, high calcium diet, regular exercise, and multivitamin daily. PCP manages labs.  Night sweats - most likely related to menopause. Discussed lifestyle changes such as less clothing and fans. Vitamin E  IUD (intrauterine device) in place - placed 10/2016. Amenorrheic.   Follow up in 1 year for annual     11/2016 [facility], 11:39 AM 05/11/2020

## 2020-05-11 NOTE — Patient Instructions (Signed)
Health Maintenance, Female Adopting a healthy lifestyle and getting preventive care are important in promoting health and wellness. Ask your health care provider about:  The right schedule for you to have regular tests and exams.  Things you can do on your own to prevent diseases and keep yourself healthy. What should I know about diet, weight, and exercise? Eat a healthy diet   Eat a diet that includes plenty of vegetables, fruits, low-fat dairy products, and lean protein.  Do not eat a lot of foods that are high in solid fats, added sugars, or sodium. Maintain a healthy weight Body mass index (BMI) is used to identify weight problems. It estimates body fat based on height and weight. Your health care provider can help determine your BMI and help you achieve or maintain a healthy weight. Get regular exercise Get regular exercise. This is one of the most important things you can do for your health. Most adults should:  Exercise for at least 150 minutes each week. The exercise should increase your heart rate and make you sweat (moderate-intensity exercise).  Do strengthening exercises at least twice a week. This is in addition to the moderate-intensity exercise.  Spend less time sitting. Even light physical activity can be beneficial. Watch cholesterol and blood lipids Have your blood tested for lipids and cholesterol at 51 years of age, then have this test every 5 years. Have your cholesterol levels checked more often if:  Your lipid or cholesterol levels are high.  You are older than 51 years of age.  You are at high risk for heart disease. What should I know about cancer screening? Depending on your health history and family history, you may need to have cancer screening at various ages. This may include screening for:  Breast cancer.  Cervical cancer.  Colorectal cancer.  Skin cancer.  Lung cancer. What should I know about heart disease, diabetes, and high blood  pressure? Blood pressure and heart disease  High blood pressure causes heart disease and increases the risk of stroke. This is more likely to develop in people who have high blood pressure readings, are of African descent, or are overweight.  Have your blood pressure checked: ? Every 3-5 years if you are 18-39 years of age. ? Every year if you are 40 years old or older. Diabetes Have regular diabetes screenings. This checks your fasting blood sugar level. Have the screening done:  Once every three years after age 40 if you are at a normal weight and have a low risk for diabetes.  More often and at a younger age if you are overweight or have a high risk for diabetes. What should I know about preventing infection? Hepatitis B If you have a higher risk for hepatitis B, you should be screened for this virus. Talk with your health care provider to find out if you are at risk for hepatitis B infection. Hepatitis C Testing is recommended for:  Everyone born from 1945 through 1965.  Anyone with known risk factors for hepatitis C. Sexually transmitted infections (STIs)  Get screened for STIs, including gonorrhea and chlamydia, if: ? You are sexually active and are younger than 51 years of age. ? You are older than 51 years of age and your health care provider tells you that you are at risk for this type of infection. ? Your sexual activity has changed since you were last screened, and you are at increased risk for chlamydia or gonorrhea. Ask your health care provider if   you are at risk.  Ask your health care provider about whether you are at high risk for HIV. Your health care provider may recommend a prescription medicine to help prevent HIV infection. If you choose to take medicine to prevent HIV, you should first get tested for HIV. You should then be tested every 3 months for as long as you are taking the medicine. Pregnancy  If you are about to stop having your period (premenopausal) and  you may become pregnant, seek counseling before you get pregnant.  Take 400 to 800 micrograms (mcg) of folic acid every day if you become pregnant.  Ask for birth control (contraception) if you want to prevent pregnancy. Osteoporosis and menopause Osteoporosis is a disease in which the bones lose minerals and strength with aging. This can result in bone fractures. If you are 65 years old or older, or if you are at risk for osteoporosis and fractures, ask your health care provider if you should:  Be screened for bone loss.  Take a calcium or vitamin D supplement to lower your risk of fractures.  Be given hormone replacement therapy (HRT) to treat symptoms of menopause. Follow these instructions at home: Lifestyle  Do not use any products that contain nicotine or tobacco, such as cigarettes, e-cigarettes, and chewing tobacco. If you need help quitting, ask your health care provider.  Do not use street drugs.  Do not share needles.  Ask your health care provider for help if you need support or information about quitting drugs. Alcohol use  Do not drink alcohol if: ? Your health care provider tells you not to drink. ? You are pregnant, may be pregnant, or are planning to become pregnant.  If you drink alcohol: ? Limit how much you use to 0-1 drink a day. ? Limit intake if you are breastfeeding.  Be aware of how much alcohol is in your drink. In the [place] one drink equals one 12 oz bottle of beer (355 mL), one 5 oz glass of wine (148 mL), or one 1 oz glass of hard liquor (44 mL). General instructions  Schedule regular health, dental, and eye exams.  Stay current with your vaccines.  Tell your health care provider if: ? You often feel depressed. ? You have ever been abused or do not feel safe at home. Summary  Adopting a healthy lifestyle and getting preventive care are important in promoting health and wellness.  Follow your health care provider's instructions about healthy  diet, exercising, and getting tested or screened for diseases.  Follow your health care provider's instructions on monitoring your cholesterol and blood pressure. This information is not intended to replace advice given to you by your health care provider. Make sure you discuss any questions you have with your health care provider. Document Revised: 10/22/2018 Document Reviewed: 10/22/2018 Elsevier Patient Education  2020 [employer]  

## 2020-10-21 ENCOUNTER — Other Ambulatory Visit: Payer: Self-pay | Admitting: Nurse Practitioner

## 2020-10-21 DIAGNOSIS — Z1231 Encounter for screening mammogram for malignant neoplasm of breast: Secondary | ICD-10-CM

## 2020-12-02 ENCOUNTER — Ambulatory Visit
Admission: RE | Admit: 2020-12-02 | Discharge: 2020-12-02 | Disposition: A | Discharge status: Home / Self Care | Payer: BC Managed Care – PPO | Source: Ambulatory Visit | Attending: Nurse Practitioner | Admitting: Nurse Practitioner

## 2020-12-02 ENCOUNTER — Other Ambulatory Visit: Payer: Self-pay

## 2020-12-02 DIAGNOSIS — Z1231 Encounter for screening mammogram for malignant neoplasm of breast: Secondary | ICD-10-CM

## 2021-05-18 ENCOUNTER — Other Ambulatory Visit: Payer: Self-pay

## 2021-05-18 ENCOUNTER — Encounter: Payer: Self-pay | Admitting: Nurse Practitioner

## 2021-05-18 ENCOUNTER — Ambulatory Visit (INDEPENDENT_AMBULATORY_CARE_PROVIDER_SITE_OTHER): Payer: BC Managed Care – PPO | Admitting: Nurse Practitioner

## 2021-05-18 VITALS — BP 122/80 | Ht 65.0 in | Wt 220.0 lb

## 2021-05-18 DIAGNOSIS — Z01419 Encounter for gynecological examination (general) (routine) without abnormal findings: Secondary | ICD-10-CM | POA: Diagnosis not present

## 2021-05-18 DIAGNOSIS — Z30431 Encounter for routine checking of intrauterine contraceptive device: Secondary | ICD-10-CM

## 2021-05-18 DIAGNOSIS — N912 Amenorrhea, unspecified: Secondary | ICD-10-CM

## 2021-05-18 DIAGNOSIS — R232 Flushing: Secondary | ICD-10-CM | POA: Diagnosis not present

## 2021-05-18 LAB — FOLLICLE STIMULATING HORMONE: FSH: 113.8 m[IU]/mL

## 2021-05-18 NOTE — Progress Notes (Signed)
   Shelby Martin April 28, 1971 [id number]   History:  50 y.o. G2 P1 presents for annual exam without GYN complaints. Amenorrheic/Mirena IUD placed 10/2016. She is experiencing some hot flashes and mood changes.  Cryosurgery greater than 20 years ago, subsequent paps normal. Normal mammogram history. 2011 gastric band.  Gynecologic History No LMP recorded. (Menstrual status: IUD).   Contraception: IUD Last Pap: 04/28/2018. Results were: normal Last mammogram: 12/02/2020. Results were: normal Last colonoscopy: never Last Dexa: 07/21/2009. Results were: normal-done for gastric sx   Past medical history, past surgical history, family history and social history were all reviewed and documented in the EPIC chart. 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 34 yo son.   ROS:  A ROS was performed and pertinent positives and negatives are included.  Exam:  Vitals:   05/18/21 0819  BP: 122/80  Weight: 220 lb (99.8 kg)  Height: 5\' 5"  (1.651 m)    Body mass index is 36.61 kg/m.  General appearance:  Normal Thyroid:  Symmetrical, normal in size, without palpable masses or nodularity. Respiratory  Auscultation:  Clear without wheezing or rhonchi Cardiovascular  Auscultation:  Regular rate, without rubs, murmurs or gallops  Edema/varicosities:  Not grossly evident Abdominal  Soft,nontender, without masses, guarding or rebound.  Liver/spleen:  No organomegaly noted  Hernia:  None appreciated  Skin  Inspection:  Grossly normal   Breasts: Examined lying and sitting.   Right: Without masses, retractions, discharge or axillary adenopathy.   Left: Without masses, retractions, discharge or axillary adenopathy. Gentitourinary   Inguinal/mons:  Normal without inguinal adenopathy  External genitalia:  Normal  BUS/Urethra/Skene's glands:  Normal  Vagina:  Normal  Cervix:  Normal, IUD visible  Uterus:  Anteverted, normal in size, shape and contour.  Midline and mobile  Adnexa/parametria:      Rt: Without masses or tenderness.   Lt: Without masses or tenderness.  Anus and perineum: Normal  Digital rectal exam: Normal sphincter tone without palpated masses or tenderness  Assessment/Plan:  50 y.o. G2 P1 for annual exam.  Well female exam with routine gynecological exam - Education provided on SBEs, importance of preventative screenings, current guidelines, high calcium diet, regular exercise, and multivitamin daily. Labs with PCP.   Encounter for routine checking of intrauterine contraceptive device (IUD) - Mirena inserted 10/2016 for contraception. Amenorrheic. She is aware of FDA approval for 7-year contraception.   Amenorrhea - Plan: Follicle stimulating hormone  Hot flashes - Plan: Follicle stimulating hormone  Screening for cervical cancer - Cryosurgery > 20 years ago, subsequent paps normal.  Will repeat at 5-year interval per guidelines.  Screening for breast cancer - Normal mammogram history.  Continue annual screenings.  Normal breast exam today.  Screening for colon cancer - Has not had screening colonoscopy. We discussed current guidelines and importance of preventative screenings. Plans to schedule this soon.   Follow up in 1 year for annual     11/2016 [facility], 8:45 AM 05/18/2021

## 2022-04-04 IMAGING — MG MM DIGITAL SCREENING BILAT W/ TOMO AND CAD
8 series · 8 of 24 positions shown · non-contrast
Comparison: Previous exam(s).

CLINICAL DATA: Screening.

EXAM:
DIGITAL SCREENING BILATERAL MAMMOGRAM WITH TOMO AND CAD

[L MLO synth-2D]
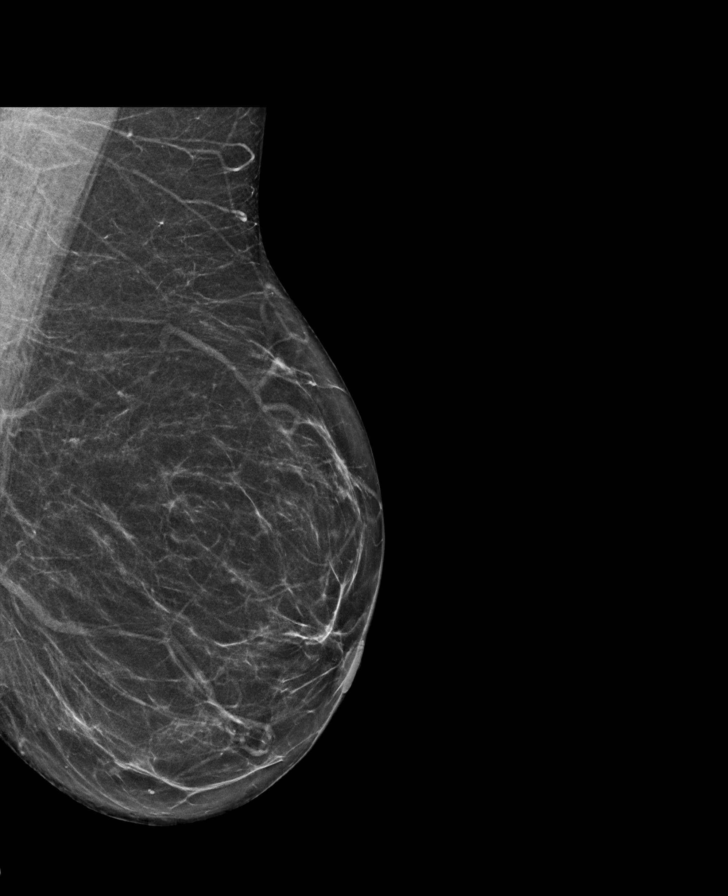

[R MLO synth-2D]
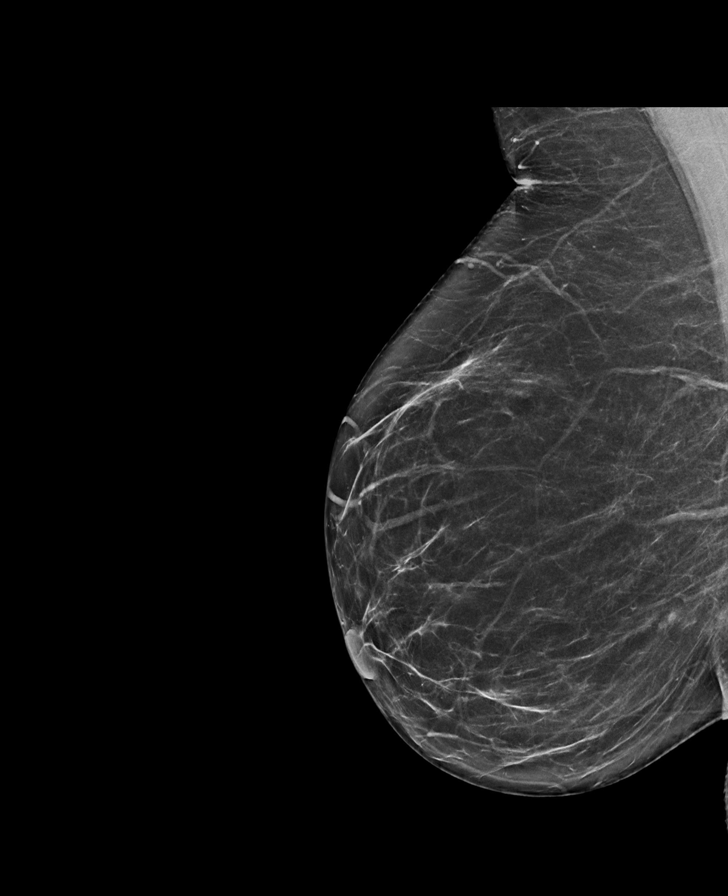

[L CC synth-2D]
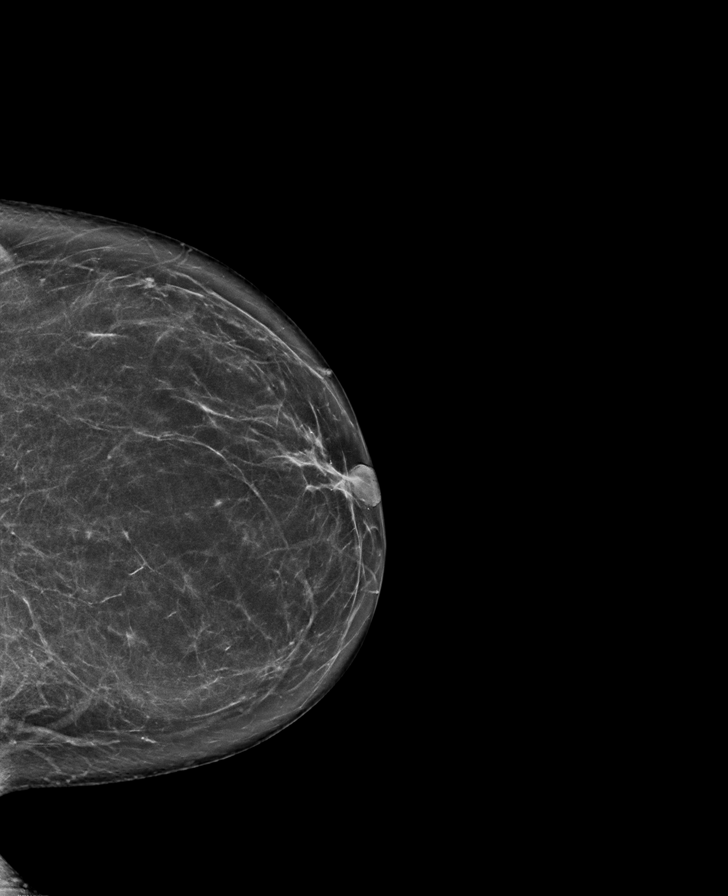

[R CC synth-2D]
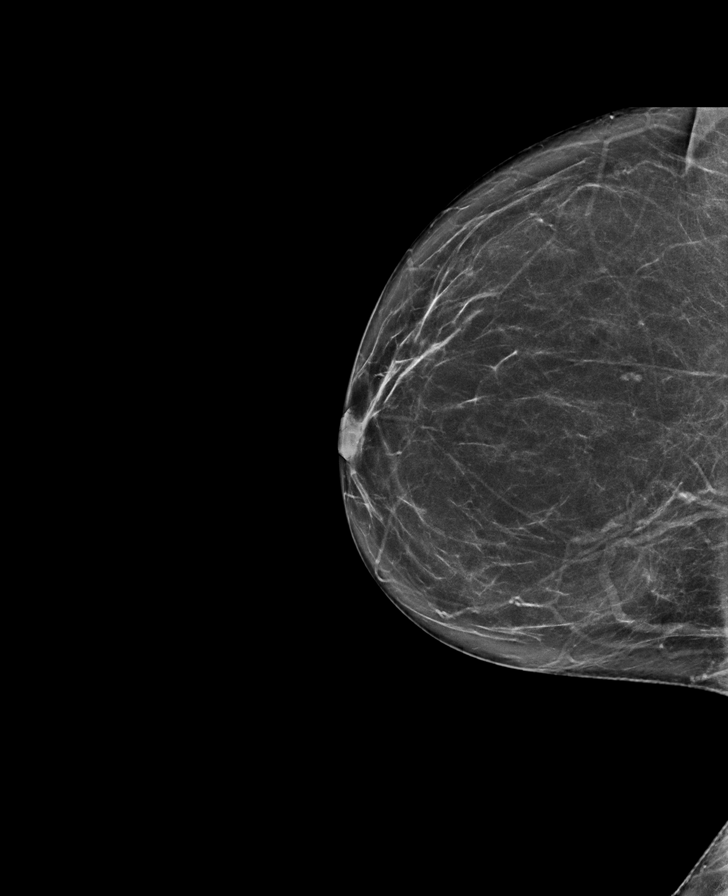

[L CC tomo · tomo slice 37/72.0]
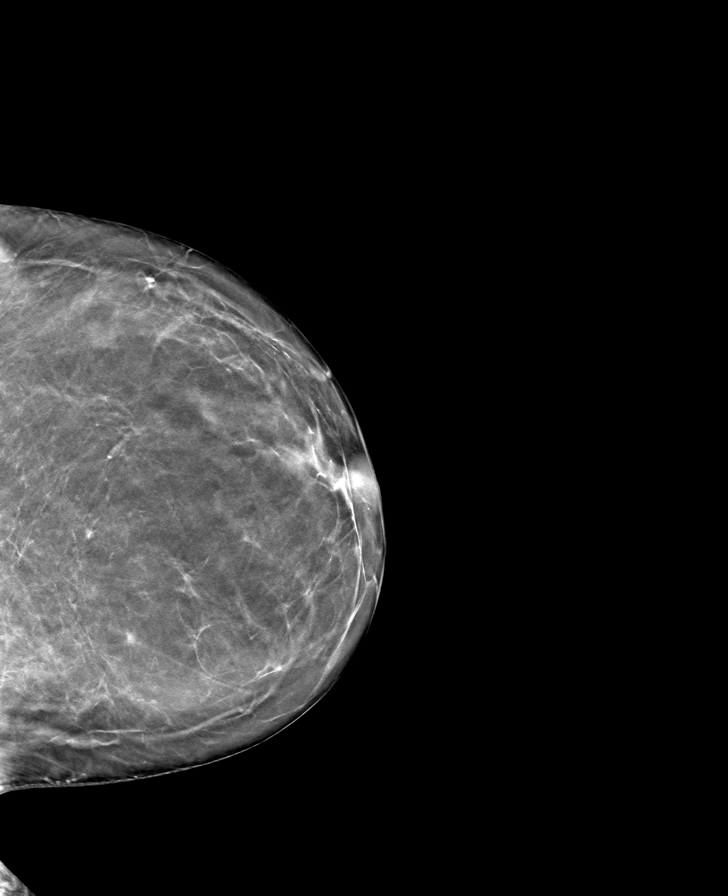

[L MLO tomo · tomo slice 33/66.0]
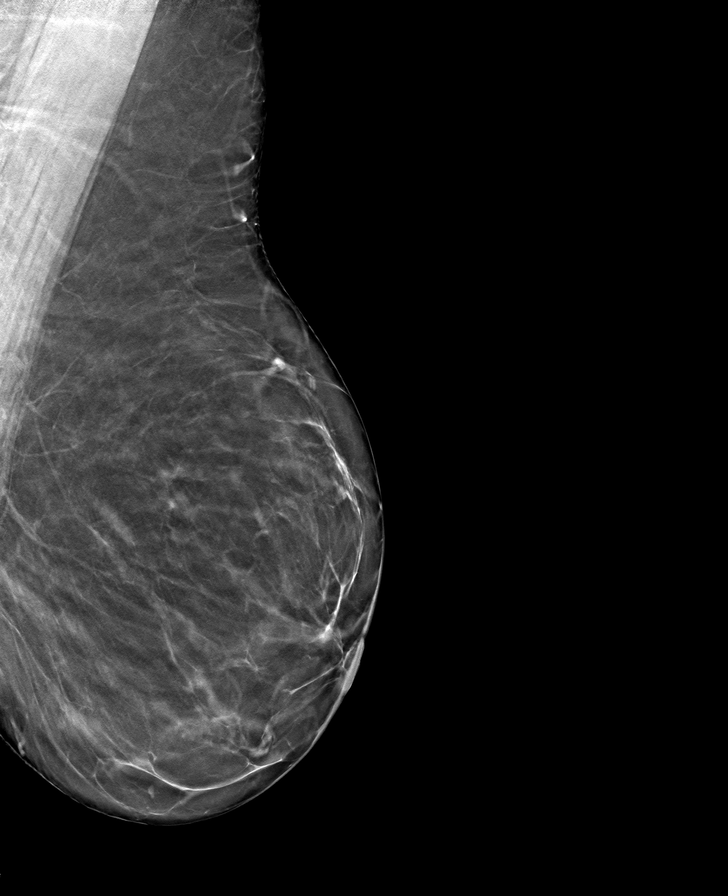

[R MLO tomo · tomo slice 36/71.0]
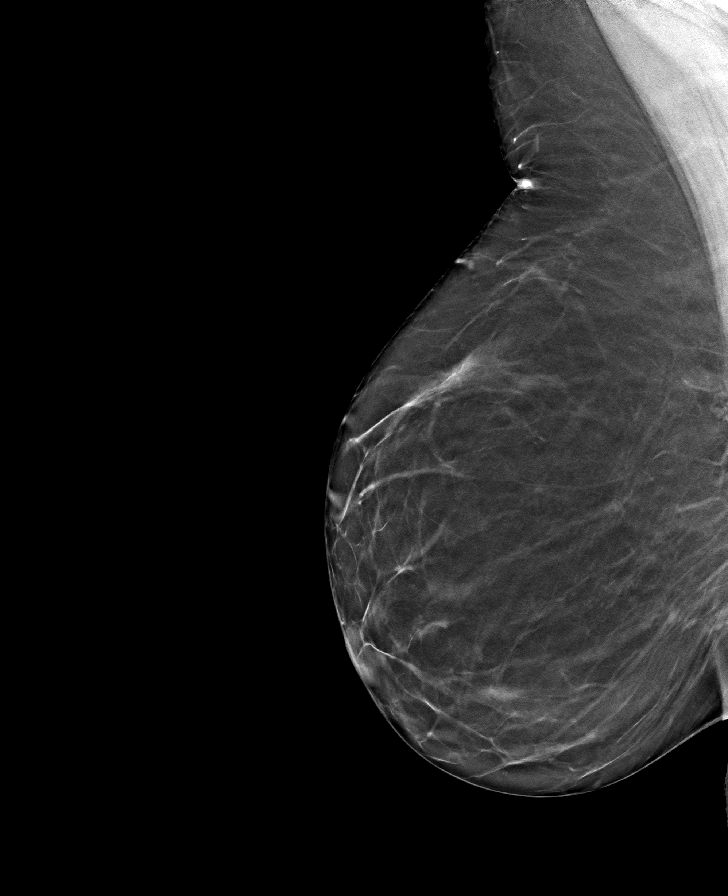

[R CC tomo · tomo slice 36/71.0]
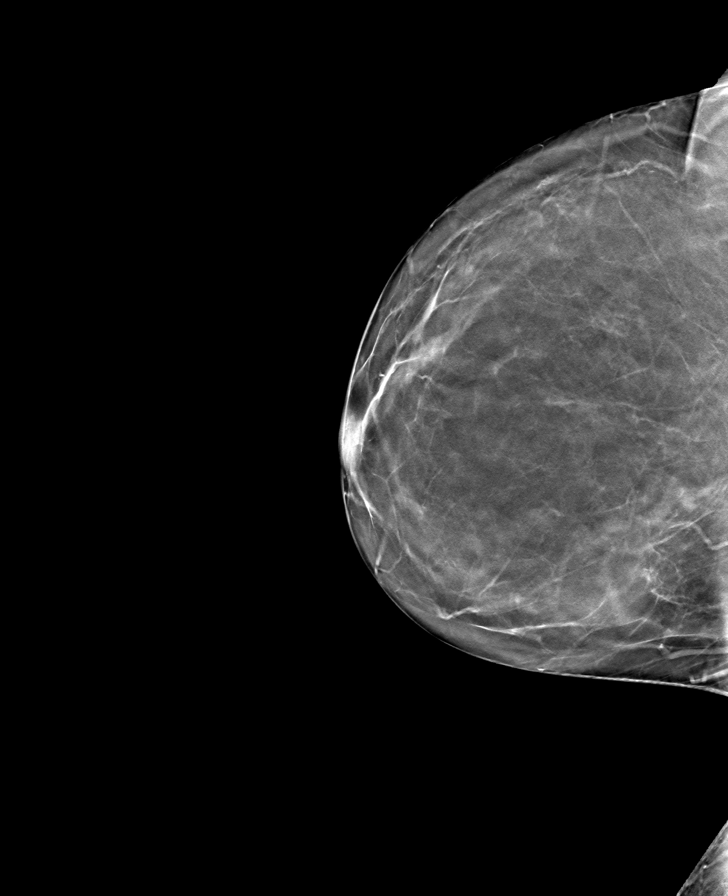

[8 of 24 positions shown; findings below may reference images not displayed]

ACR Breast Density Category b: There are scattered areas of
fibroglandular density.
FINDINGS: There are no findings suspicious for malignancy. The images were
evaluated with computer-aided detection.
IMPRESSION: No mammographic evidence of malignancy. A result letter of this
screening mammogram will be mailed directly to the patient.

RECOMMENDATION:
Screening mammogram in one year. (Code:ZP-7-VX7)

BI-RADS CATEGORY  1: Negative.

## 2022-05-22 ENCOUNTER — Ambulatory Visit (INDEPENDENT_AMBULATORY_CARE_PROVIDER_SITE_OTHER): Payer: BC Managed Care – PPO | Admitting: Nurse Practitioner

## 2022-05-22 ENCOUNTER — Encounter: Payer: Self-pay | Admitting: Nurse Practitioner

## 2022-05-22 VITALS — BP 98/72 | HR 86 | Ht 65.5 in | Wt 232.0 lb

## 2022-05-22 DIAGNOSIS — Z13820 Encounter for screening for osteoporosis: Secondary | ICD-10-CM

## 2022-05-22 DIAGNOSIS — Z01419 Encounter for gynecological examination (general) (routine) without abnormal findings: Secondary | ICD-10-CM

## 2022-05-22 DIAGNOSIS — Z780 Asymptomatic menopausal state: Secondary | ICD-10-CM

## 2022-05-22 DIAGNOSIS — Z30431 Encounter for routine checking of intrauterine contraceptive device: Secondary | ICD-10-CM

## 2022-05-22 DIAGNOSIS — K912 Postsurgical malabsorption, not elsewhere classified: Secondary | ICD-10-CM

## 2022-05-22 NOTE — Progress Notes (Signed)
Shelby Martin 1970/12/03 [id number]   History:  51 y.o. G2 P1 presents for annual exam without GYN complaints. Mirena IUD placed 10/2016. FSH 113 a year ago. Complains of mild hot flashes and mood changes. Cryosurgery greater than 20 years ago, subsequent paps normal. Normal mammogram history. 2011 gastric band surgery.   Gynecologic History No LMP recorded. (Menstrual status: IUD).   Contraception: IUD Sexually active: Yes  Health maintenance Last Pap: 04/28/2018. Results were: Normal, 5-year repeat Last mammogram: 12/02/2020. Results were: Normal Last colonoscopy: Never Last Dexa: 07/21/2009. Results were: Normal-done for gastric sx   Past medical history, past surgical history, family history and social history were all reviewed and documented in the EPIC chart. 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 33 yo son, lives in [place], granddaughter due in October.   ROS:  A ROS was performed and pertinent positives and negatives are included.  Exam:  Vitals:   05/22/22 0815  BP: 98/72  Pulse: 86  SpO2: 99%  Weight: 232 lb (105.2 kg)  Height: 5' 5.5" (1.664 m)     Body mass index is 38.02 kg/m.  General appearance:  Normal Thyroid:  Symmetrical, normal in size, without palpable masses or nodularity. Respiratory  Auscultation:  Clear without wheezing or rhonchi Cardiovascular  Auscultation:  Regular rate, without rubs, murmurs or gallops  Edema/varicosities:  Not grossly evident Abdominal  Soft,nontender, without masses, guarding or rebound.  Liver/spleen:  No organomegaly noted  Hernia:  None appreciated  Skin  Inspection:  Grossly normal   Breasts: Examined lying and sitting.   Right: Without masses, retractions, discharge or axillary adenopathy.   Left: Without masses, retractions, discharge or axillary adenopathy. Genitourinary   Inguinal/mons:  Normal without inguinal adenopathy  External genitalia:  Normal appearing vulva with no masses, tenderness, or  lesions  BUS/Urethra/Skene's glands:  Normal  Vagina:  Normal appearing with normal color and discharge, no lesions  Cervix:  Normal appearing without discharge or lesions. IUD string visible  Uterus:  Normal in size, shape and contour.  Midline and mobile, nontender  Adnexa/parametria:     Rt: Normal in size, without masses or tenderness.   Lt: Normal in size, without masses or tenderness.  Anus and perineum: Normal  Digital rectal exam: Normal sphincter tone without palpated masses or tenderness  Patient informed chaperone available to be present for breast and pelvic exam. Patient has requested no chaperone to be present. Patient has been advised what will be completed during breast and pelvic exam.   Assessment/Plan:  51 y.o. G2 P1 for annual exam.  Well female exam with routine gynecological exam - Education provided on SBEs, importance of preventative screenings, current guidelines, high calcium diet, regular exercise, and multivitamin daily. Labs with PCP.   Encounter for routine checking of intrauterine contraceptive device (IUD) - Mirena inserted 10/2016. Amenorrheic. She is aware of 8-year FDA approval.   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 Will repeat DXA now due to malabsorption. Has not been taking supplements. Recommend Vitamin D, calcium, and magnesium.   Postmenopausal - Plan: DG Bone Density. No HRT. Having mild hot flashes and mood changes. Provided her with list of OTC supplements.   Screening for osteoporosis - Plan: DG Bone Density. Normal DXA in 2010 prior to gastric surgery.   Screening for cervical cancer - Cryosurgery >20 years ago, subsequent paps normal.  Will repeat at 5-year interval per guidelines.  Screening for breast cancer - Normal mammogram history. Overdue and encouraged to schedule  soon.  Normal breast exam today.  Screening for colon cancer - Has not had screening colonoscopy. We discussed current  guidelines and importance of preventative screenings. Also discussed option for Cologuard.   Follow up in 1 year for annual.     [name] [facility], 8:24 AM 05/22/2022

## 2022-06-18 ENCOUNTER — Other Ambulatory Visit: Payer: Self-pay | Admitting: Nurse Practitioner

## 2022-06-18 DIAGNOSIS — Z1231 Encounter for screening mammogram for malignant neoplasm of breast: Secondary | ICD-10-CM

## 2022-06-19 ENCOUNTER — Ambulatory Visit (INDEPENDENT_AMBULATORY_CARE_PROVIDER_SITE_OTHER): Payer: BC Managed Care – PPO

## 2022-06-19 DIAGNOSIS — Z780 Asymptomatic menopausal state: Secondary | ICD-10-CM

## 2022-06-19 DIAGNOSIS — K912 Postsurgical malabsorption, not elsewhere classified: Secondary | ICD-10-CM

## 2022-06-19 DIAGNOSIS — Z13820 Encounter for screening for osteoporosis: Secondary | ICD-10-CM

## 2022-06-29 ENCOUNTER — Ambulatory Visit
Admission: RE | Admit: 2022-06-29 | Discharge: 2022-06-29 | Disposition: A | Discharge status: Home / Self Care | Payer: BC Managed Care – PPO | Source: Ambulatory Visit | Attending: Nurse Practitioner | Admitting: Nurse Practitioner

## 2022-06-29 DIAGNOSIS — Z1231 Encounter for screening mammogram for malignant neoplasm of breast: Secondary | ICD-10-CM

## 2023-01-31 ENCOUNTER — Other Ambulatory Visit: Payer: Self-pay | Admitting: Physician Assistant

## 2023-01-31 DIAGNOSIS — Z1231 Encounter for screening mammogram for malignant neoplasm of breast: Secondary | ICD-10-CM

## 2023-05-28 ENCOUNTER — Encounter: Payer: Self-pay | Admitting: Nurse Practitioner

## 2023-05-28 ENCOUNTER — Ambulatory Visit (INDEPENDENT_AMBULATORY_CARE_PROVIDER_SITE_OTHER): Payer: BC Managed Care – PPO | Admitting: Nurse Practitioner

## 2023-05-28 ENCOUNTER — Other Ambulatory Visit (HOSPITAL_COMMUNITY)
Admission: RE | Admit: 2023-05-28 | Discharge: 2023-05-28 | Disposition: A | Discharge status: Home / Self Care | Payer: BC Managed Care – PPO | Source: Ambulatory Visit | Attending: Nurse Practitioner | Admitting: Nurse Practitioner

## 2023-05-28 VITALS — BP 112/84 | HR 77 | Ht 66.0 in | Wt 226.0 lb

## 2023-05-28 DIAGNOSIS — Z124 Encounter for screening for malignant neoplasm of cervix: Secondary | ICD-10-CM | POA: Diagnosis present

## 2023-05-28 DIAGNOSIS — Z01419 Encounter for gynecological examination (general) (routine) without abnormal findings: Secondary | ICD-10-CM | POA: Diagnosis not present

## 2023-05-28 DIAGNOSIS — Z30431 Encounter for routine checking of intrauterine contraceptive device: Secondary | ICD-10-CM

## 2023-05-28 DIAGNOSIS — N951 Menopausal and female climacteric states: Secondary | ICD-10-CM

## 2023-05-28 NOTE — Progress Notes (Signed)
Shelby Martin 1971-10-13 [id number]   History:  52 y.o. G2 P1 presents for annual exam without GYN complaints. Mirena IUD placed 10/2016. Complains of feeling warm all the time and experiencing hot flashes. FSH 113 two years ago. Cryosurgery greater than 20 years ago, subsequent paps normal. Normal mammogram history. 2011 gastric band surgery.   Gynecologic History No LMP recorded. (Menstrual status: IUD).   Contraception: IUD Sexually active: Yes  Health maintenance Last Pap: 04/28/2018. Results were: Normal neg HPV, 5-year repeat Last mammogram: 06/29/2022. Results were: Normal Last colonoscopy: 01/2023. Results: Benign polyps, 5-year recall Last Dexa: 06/19/2022. Results were: Normal  Past medical history, past surgical history, family history and social history were all reviewed and documented in the EPIC chart. 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 54 yo son, lives in [place], 64 and 1 yo daughters.   ROS:  A ROS was performed and pertinent positives and negatives are included.  Exam:  Vitals:   05/28/23 0752  BP: 112/84  Pulse: 77  SpO2: 100%  Weight: 226 lb (102.5 kg)  Height: 5\' 6"  (1.676 m)      Body mass index is 36.48 kg/m.  General appearance:  Normal Thyroid:  Symmetrical, normal in size, without palpable masses or nodularity. Respiratory  Auscultation:  Clear without wheezing or rhonchi Cardiovascular  Auscultation:  Regular rate, without rubs, murmurs or gallops  Edema/varicosities:  Not grossly evident Abdominal  Soft,nontender, without masses, guarding or rebound.  Liver/spleen:  No organomegaly noted  Hernia:  None appreciated  Skin  Inspection:  Grossly normal   Breasts: Examined lying and sitting.   Right: Without masses, retractions, discharge or axillary adenopathy.   Left: Without masses, retractions, discharge or axillary adenopathy. Genitourinary   Inguinal/mons:  Normal without inguinal adenopathy  External genitalia:  Normal  appearing vulva with no masses, tenderness, or lesions  BUS/Urethra/Skene's glands:  Normal  Vagina:  Normal appearing with normal color and discharge, no lesions  Cervix:  Normal appearing without discharge or lesions. IUD string visible  Uterus:  Normal in size, shape and contour.  Midline and mobile, nontender  Adnexa/parametria:     Rt: Normal in size, without masses or tenderness.   Lt: Normal in size, without masses or tenderness.  Anus and perineum: Normal  Digital rectal exam: Not indicated  Patient informed chaperone available to be present for breast and pelvic exam. Patient has requested no chaperone to be present. Patient has been advised what will be completed during breast and pelvic exam.   Assessment/Plan:  52 y.o. G2 P1 for annual exam.  Well female exam with routine gynecological exam - Education provided on SBEs, importance of preventative screenings, current guidelines, high calcium diet, regular exercise, and multivitamin daily. Labs with PCP.   Encounter for routine checking of intrauterine contraceptive device (IUD) - Mirena inserted 10/2016. Amenorrheic. She is aware of 8-year FDA approval.   Postmenopausal - No HRT. Having hot flashes  Hot flashes due to menopause - Discussed treatment options with OTC supplements, SSRI, Veozah and HRT. She would like to try OTC options first. List provided.  Screening for cervical cancer - Plan: Cytology - PAP( [facility]). Cryosurgery >20 years ago, subsequent paps normal.    Screening for breast cancer - Normal mammogram history. Continue annual screenings. Normal breast exam today.  Screening for colon cancer - 01/2023 colonoscopy. Will repeat at GI's recommended interval.   Screening for osteoporosis - Normal bone density 06/2022. Will repeat in 5 years per recommendation. Vit D +  Calcium and regular exercise.   Follow up in 1 year for annual.     [name] [facility], 8:16 AM 05/28/2023

## 2023-05-29 LAB — CYTOLOGY - PAP
Comment: NEGATIVE
Diagnosis: NEGATIVE
High risk HPV: NEGATIVE

## 2023-07-01 ENCOUNTER — Ambulatory Visit: Payer: BC Managed Care – PPO

## 2023-07-02 ENCOUNTER — Ambulatory Visit
Admission: RE | Admit: 2023-07-02 | Discharge: 2023-07-02 | Disposition: A | Discharge status: Home / Self Care | Payer: BC Managed Care – PPO | Source: Ambulatory Visit | Attending: Physician Assistant | Admitting: Physician Assistant

## 2023-07-02 DIAGNOSIS — Z1231 Encounter for screening mammogram for malignant neoplasm of breast: Secondary | ICD-10-CM

## 2024-02-05 ENCOUNTER — Other Ambulatory Visit: Payer: Self-pay | Admitting: Physician Assistant

## 2024-02-05 DIAGNOSIS — Z1231 Encounter for screening mammogram for malignant neoplasm of breast: Secondary | ICD-10-CM

## 2024-07-02 ENCOUNTER — Inpatient Hospital Stay
Admission: RE | Admit: 2024-07-02 | Discharge: 2024-07-02 | Discharge status: Home / Self Care | Payer: Self-pay | Source: Ambulatory Visit | Attending: Physician Assistant | Admitting: Physician Assistant

## 2024-07-02 DIAGNOSIS — Z1231 Encounter for screening mammogram for malignant neoplasm of breast: Secondary | ICD-10-CM
